# Patient Record
Sex: Female | Born: 1970 | Race: White | Hispanic: No | Smoking: Former smoker
Health system: Southern US, Community
[De-identification: ages and names within clinical notes are randomized; demographics above are authoritative.]

## PROBLEM LIST (undated history)

## (undated) DIAGNOSIS — G8929 Other chronic pain: Secondary | ICD-10-CM

## (undated) DIAGNOSIS — K589 Irritable bowel syndrome without diarrhea: Secondary | ICD-10-CM

## (undated) DIAGNOSIS — I341 Nonrheumatic mitral (valve) prolapse: Secondary | ICD-10-CM

## (undated) DIAGNOSIS — E785 Hyperlipidemia, unspecified: Secondary | ICD-10-CM

## (undated) DIAGNOSIS — F32A Depression, unspecified: Secondary | ICD-10-CM

## (undated) DIAGNOSIS — R51 Headache: Secondary | ICD-10-CM

## (undated) DIAGNOSIS — F329 Major depressive disorder, single episode, unspecified: Secondary | ICD-10-CM

## (undated) DIAGNOSIS — F419 Anxiety disorder, unspecified: Secondary | ICD-10-CM

## (undated) DIAGNOSIS — I471 Supraventricular tachycardia, unspecified: Secondary | ICD-10-CM

## (undated) DIAGNOSIS — D219 Benign neoplasm of connective and other soft tissue, unspecified: Secondary | ICD-10-CM

## (undated) DIAGNOSIS — E559 Vitamin D deficiency, unspecified: Secondary | ICD-10-CM

## (undated) DIAGNOSIS — R87629 Unspecified abnormal cytological findings in specimens from vagina: Secondary | ICD-10-CM

## (undated) DIAGNOSIS — N390 Urinary tract infection, site not specified: Secondary | ICD-10-CM

## (undated) DIAGNOSIS — K635 Polyp of colon: Secondary | ICD-10-CM

## (undated) DIAGNOSIS — R519 Headache, unspecified: Secondary | ICD-10-CM

## (undated) DIAGNOSIS — M797 Fibromyalgia: Secondary | ICD-10-CM

## (undated) HISTORY — PX: FOOT SURGERY: SHX648

## (undated) HISTORY — DX: Anxiety disorder, unspecified: F41.9

## (undated) HISTORY — PX: BREAST EXCISIONAL BIOPSY: SUR124

## (undated) HISTORY — DX: Major depressive disorder, single episode, unspecified: F32.9

## (undated) HISTORY — DX: Supraventricular tachycardia, unspecified: I47.10

## (undated) HISTORY — DX: Fibromyalgia: M79.7

## (undated) HISTORY — DX: Headache: R51

## (undated) HISTORY — DX: Headache, unspecified: R51.9

## (undated) HISTORY — DX: Supraventricular tachycardia: I47.1

## (undated) HISTORY — DX: Other chronic pain: G89.29

## (undated) HISTORY — PX: TUBAL LIGATION: SHX77

## (undated) HISTORY — DX: Nonrheumatic mitral (valve) prolapse: I34.1

## (undated) HISTORY — DX: Irritable bowel syndrome, unspecified: K58.9

## (undated) HISTORY — PX: CERVICAL BIOPSY  W/ LOOP ELECTRODE EXCISION: SUR135

## (undated) HISTORY — DX: Unspecified abnormal cytological findings in specimens from vagina: R87.629

## (undated) HISTORY — DX: Polyp of colon: K63.5

## (undated) HISTORY — DX: Benign neoplasm of connective and other soft tissue, unspecified: D21.9

## (undated) HISTORY — PX: WISDOM TOOTH EXTRACTION: SHX21

## (undated) HISTORY — DX: Hyperlipidemia, unspecified: E78.5

## (undated) HISTORY — PX: OTHER SURGICAL HISTORY: SHX169

## (undated) HISTORY — DX: Urinary tract infection, site not specified: N39.0

## (undated) HISTORY — DX: Vitamin D deficiency, unspecified: E55.9

## (undated) HISTORY — DX: Depression, unspecified: F32.A

## (undated) HISTORY — PX: BREAST BIOPSY: SHX20

---

## 2017-03-25 ENCOUNTER — Telehealth: Payer: Self-pay

## 2017-03-25 NOTE — Telephone Encounter (Signed)
SENT NOTES TO SCHEDULING 
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
## 2017-04-15 ENCOUNTER — Ambulatory Visit (INDEPENDENT_AMBULATORY_CARE_PROVIDER_SITE_OTHER): Payer: Medicaid Other | Admitting: Internal Medicine

## 2017-04-15 ENCOUNTER — Encounter: Payer: Self-pay | Admitting: Internal Medicine

## 2017-04-15 ENCOUNTER — Encounter (INDEPENDENT_AMBULATORY_CARE_PROVIDER_SITE_OTHER): Payer: Self-pay

## 2017-04-15 VITALS — BP 112/82 | HR 64 | Ht 67.0 in | Wt 218.4 lb

## 2017-04-15 DIAGNOSIS — I471 Supraventricular tachycardia: Secondary | ICD-10-CM | POA: Diagnosis not present

## 2017-04-15 DIAGNOSIS — Z7689 Persons encountering health services in other specified circumstances: Secondary | ICD-10-CM

## 2017-04-15 NOTE — Patient Instructions (Signed)
Your physician recommends that you continue on your current medications as directed. Please refer to the Current Medication list given to you today.  Your physician wants you to follow-up in: 1 year with Dr. Harrington Challenger.  You will receive a reminder letter in the mail two months in advance. If you don't receive a letter, please call our office to schedule the follow-up appointment.  We will request your recent lab work from Dr. Boston Service.  We will contact you after Dr. Harrington Challenger reviews if there are any new recommendations.

## 2017-04-15 NOTE — Progress Notes (Addendum)
Cardiology Office Note   Date:  04/15/2017   ID:  Chelsea Cherry, DOB 03-Nov-1970, MRN [id number]  PCP:  [facility]  Cardiologist:   Dorris Carnes, MD   Pt is self referred for continued cardiac care     History of Present Illness: Chelsea Cherry is a 47 y.o. female with a history of palpitations and reported  SVT     She recently moved to [facility] from [place]  Was followed there for years  Last cardiologist in [place].  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   Stress echo 09/2014  Normal   Hx PVCs,  Hx SVT    Current Meds  Medication Sig  . DULoxetine (CYMBALTA) 30 MG capsule Take 90 mg by mouth daily.  Marland Kitchen gabapentin (NEURONTIN) 300 MG capsule Take 1,500 mg by mouth as directed.  Marland Kitchen glucosamine-chondroitin 500-400 MG tablet Take 1 tablet by mouth 3 (three) times daily.  Marland Kitchen lamoTRIgine (LAMICTAL) 200 MG tablet Take 300 mg by mouth daily.  Marland Kitchen loratadine (CLARITIN) 10 MG tablet Take 10 mg by mouth daily.  . methocarbamol (ROBAXIN) 750 MG tablet Take 1,500 mg by mouth at bedtime.  . metoprolol succinate (TOPROL-XL) 50 MG 24 hr tablet Take 75 mg by mouth daily. Take with or immediately following a meal.  . Multiple Vitamin (MULTIVITAMIN) tablet Take 1 tablet by mouth daily.  . naproxen (NAPROSYN) 500 MG tablet Take 500 mg by mouth daily.  Marland Kitchen omeprazole (PRILOSEC) 20 MG capsule Take 20 mg by mouth daily.     Allergies:   Patient has no known allergies.   Past Medical History:  Diagnosis Date  . Fibromyalgia   . Mitral valve prolapse      Past Surgical History:  Procedure Laterality Date  . BREAST LUMPECTOMY    . TUBAL LIGATION       Social History:  The patient  reports that she has quit smoking. she has never used smokeless tobacco.   Family History:  The patient's family history includes Fibromyalgia in her mother.    ROS:  Please see the history of present illness. All other systems are reviewed and  Negative to the above problem except as noted.    PHYSICAL EXAM: VS:  BP 112/82   Pulse 64   Ht 5\' 7"  (1.702 m)   Wt 218 lb 6.4 oz (99.1 kg)   BMI 34.21 kg/m   GEN: Obese 47 yo  in no acute distress  HEENT: normal  Neck: no JVD, carotid bruits, or masses Cardiac: RRR; no murmurs, rubs, or gallops,no edema  Respiratory:  clear to auscultation bilaterally, normal work of breathing GI: soft, nontender, nondistended, + BS  No hepatomegaly  MS: no deformity Moving all extremities   Skin: warm and dry, no rash Neuro:  Strength and sensation are intact Psych: euthymic mood, full affect  EKG:  EKG is ordered today.SR  64 bpm     Lipid Panel No results found for: CHOL, TRIG, HDL, CHOLHDL, VLDL, LDLCALC, LDLDIRECT    Wt Readings from Last 3 Encounters:  04/15/17 218 lb 6.4 oz (99.1 kg)      ASSESSMENT AND PLAN:  1  Rhythm  Reported history of SVT  Will get records   Doing OK with no recent recurrence   Continue on metoprolol  2  Cardiac risk assessment.  Mother with CAD   Was a big smoker    Will f/u with lipids from Dr Bobby Rumpf' office.   Review records to determine if any action needed  Encouraged her to stay active   I do not think she is having any active symptoms     F/U in 1 year      Current medicines are reviewed at length with the patient today.  The patient does not have concerns regarding medicines.  Signed, Dorris Carnes, MD  04/15/2017 3:24 PM    [facility] [address], [place], [place]  [address] Phone: [phone]; Fax: [phone]

## 2017-04-17 ENCOUNTER — Ambulatory Visit (INDEPENDENT_AMBULATORY_CARE_PROVIDER_SITE_OTHER): Payer: Medicaid Other | Admitting: Psychiatry

## 2017-04-17 ENCOUNTER — Encounter (HOSPITAL_COMMUNITY): Payer: Self-pay | Admitting: Psychiatry

## 2017-04-17 VITALS — BP 141/83 | HR 95 | Ht 67.0 in | Wt 217.0 lb

## 2017-04-17 DIAGNOSIS — F419 Anxiety disorder, unspecified: Secondary | ICD-10-CM | POA: Diagnosis not present

## 2017-04-17 DIAGNOSIS — F331 Major depressive disorder, recurrent, moderate: Secondary | ICD-10-CM | POA: Diagnosis not present

## 2017-04-17 DIAGNOSIS — F4310 Post-traumatic stress disorder, unspecified: Secondary | ICD-10-CM

## 2017-04-17 DIAGNOSIS — Z87891 Personal history of nicotine dependence: Secondary | ICD-10-CM

## 2017-04-17 DIAGNOSIS — F39 Unspecified mood [affective] disorder: Secondary | ICD-10-CM | POA: Diagnosis not present

## 2017-04-17 DIAGNOSIS — Z818 Family history of other mental and behavioral disorders: Secondary | ICD-10-CM

## 2017-04-17 DIAGNOSIS — Z811 Family history of alcohol abuse and dependence: Secondary | ICD-10-CM

## 2017-04-17 DIAGNOSIS — I998 Other disorder of circulatory system: Secondary | ICD-10-CM | POA: Diagnosis not present

## 2017-04-17 DIAGNOSIS — Z62810 Personal history of physical and sexual abuse in childhood: Secondary | ICD-10-CM | POA: Diagnosis not present

## 2017-04-17 DIAGNOSIS — M797 Fibromyalgia: Secondary | ICD-10-CM

## 2017-04-17 MED ORDER — GABAPENTIN 300 MG PO CAPS: ORAL_CAPSULE | ORAL | 1 refills | Status: DC

## 2017-04-17 MED ORDER — LAMOTRIGINE 200 MG PO TABS: 300.0000 mg | ORAL_TABLET | Freq: Every day | ORAL | 1 refills | Status: DC

## 2017-04-17 NOTE — Patient Instructions (Signed)
1. Continue lamotrigine 300 mg daily  2. Continue gabapentin 300 mg in AM, 1200 mg qPM 2. (She is on duloxetine 90 mg daily) 3. Return to clinic in two months for 30 mins

## 2017-04-28 ENCOUNTER — Telehealth: Payer: Self-pay | Admitting: Internal Medicine

## 2017-04-28 NOTE — Telephone Encounter (Signed)
Records received from [facility] in [facility]

## 2017-05-07 ENCOUNTER — Other Ambulatory Visit: Payer: Self-pay | Admitting: *Deleted

## 2017-05-07 ENCOUNTER — Other Ambulatory Visit (HOSPITAL_COMMUNITY): Payer: Self-pay | Admitting: Psychiatry

## 2017-05-07 MED ORDER — METOPROLOL SUCCINATE ER 50 MG PO TB24: ORAL_TABLET | ORAL | 11 refills | Status: DC

## 2017-05-07 MED ORDER — DULOXETINE HCL 30 MG PO CPEP: 90.0000 mg | ORAL_CAPSULE | Freq: Every day | ORAL | 1 refills | Status: DC

## 2017-05-22 ENCOUNTER — Encounter: Payer: Self-pay | Admitting: Physician Assistant

## 2017-06-02 ENCOUNTER — Encounter (INDEPENDENT_AMBULATORY_CARE_PROVIDER_SITE_OTHER): Payer: Self-pay

## 2017-06-02 ENCOUNTER — Ambulatory Visit: Payer: Medicaid Other | Admitting: Physician Assistant

## 2017-06-02 ENCOUNTER — Encounter: Payer: Self-pay | Admitting: Physician Assistant

## 2017-06-02 VITALS — BP 100/62 | HR 85 | Ht 67.0 in | Wt 220.0 lb

## 2017-06-02 DIAGNOSIS — Z860100 Personal history of colon polyps, unspecified: Secondary | ICD-10-CM

## 2017-06-02 DIAGNOSIS — Z8719 Personal history of other diseases of the digestive system: Secondary | ICD-10-CM | POA: Diagnosis not present

## 2017-06-02 DIAGNOSIS — K625 Hemorrhage of anus and rectum: Secondary | ICD-10-CM

## 2017-06-02 DIAGNOSIS — K219 Gastro-esophageal reflux disease without esophagitis: Secondary | ICD-10-CM | POA: Diagnosis not present

## 2017-06-02 DIAGNOSIS — Z8711 Personal history of peptic ulcer disease: Secondary | ICD-10-CM

## 2017-06-02 MED ORDER — OMEPRAZOLE 20 MG PO CPDR: 20.0000 mg | DELAYED_RELEASE_CAPSULE | Freq: Every day | ORAL | 3 refills | Status: DC

## 2017-06-02 MED ORDER — NA SULFATE-K SULFATE-MG SULF 17.5-3.13-1.6 GM/177ML PO SOLN: 1.0000 | ORAL | 0 refills | Status: DC

## 2017-06-02 NOTE — Progress Notes (Signed)
Reviewed and agree with initial management plan.  Angelena Sand T. Omah Dewalt, MD FACG 

## 2017-06-02 NOTE — Patient Instructions (Addendum)
You have been scheduled for an endoscopy and colonoscopy. Please follow the written instructions given to you at your visit today. Please pick up your prep supplies at the pharmacy within the next 1-3 days. If you use inhalers (even only as needed), please bring them with you on the day of your procedure. Your physician has requested that you go to www.startemmi.com and enter the access code given to you at your visit today. This web site gives a general overview about your procedure. However, you should still follow specific instructions given to you by our office regarding your preparation for the procedure.  We have sent the following medications to your pharmacy for you to pick up at your convenience: Omeprazole 20 mg daily

## 2017-06-02 NOTE — Progress Notes (Signed)
Chief Complaint: "burning in my esophagus and blood in my stool"  HPI:    Chelsea Cherry is a 47 year old female with a past medical history of fibromyalgia and mitral valve prolapse who presents to clinic today as a referral from her primary care physician, Ricky Ala, NP,  for a complaint of "burning in my esophagus and blood in my stool".    Today, explains she moved from [place] to [place] in October.  Has a history of rectal bleeding in 2000 for which she had a colonoscopy and reports a finding of polyps and internal hemorrhoids, though she cannot remember where this was done and does not have her report.  Also reports history of a gastric ulcer in 2014 for which she was started on Omeprazole 20 mg daily.  She never had a repeat EGD.    Remains on Omeprazole 20 mg daily for reflux symptoms.  If she stops this medication for even more than 1 day she will have "burning in my esophagus".  On her Omeprazole she does well.    Also rectal bleeding which was intermittent over the past couple of years but became constant 2 months ago. Occurs with every bowel movement, sometimes mixed in with the stool as well on the toilet paper and in the toilet.  Denies straining but does have a harder stool a couple times a week depending on her diet.    Denies fever, chills, nausea, vomiting, dysphagia, abdominal or rectal pain, or symptoms that awaken her at night.   Past Medical History:  Diagnosis Date  . Fibromyalgia   . Mitral valve prolapse     Past Surgical History:  Procedure Laterality Date  . BREAST LUMPECTOMY    . TUBAL LIGATION      Current Outpatient Medications  Medication Sig Dispense Refill  . DULoxetine (CYMBALTA) 30 MG capsule Take 3 capsules (90 mg total) by mouth daily. (Patient taking differently: Take 90 mg by mouth daily at 2 PM. ) 90 capsule 1  . gabapentin (NEURONTIN) 300 MG capsule 300 mg (1 tab) in AM, 1200 mg (4 tab) in PM 150 capsule 1  . glucosamine-chondroitin  500-400 MG tablet Take 1 tablet by mouth 2 (two) times daily.     Marland Kitchen lamoTRIgine (LAMICTAL) 200 MG tablet Take 1.5 tablets (300 mg total) by mouth daily. 45 tablet 1  . loratadine (CLARITIN) 10 MG tablet Take 10 mg by mouth daily.    . methocarbamol (ROBAXIN) 750 MG tablet Take 1,500 mg by mouth at bedtime.    . metoprolol succinate (TOPROL-XL) 50 MG 24 hr tablet Take one and one-half tablet by mouth daily.Take with or immediately following a meal. 45 tablet 11  . Multiple Vitamin (MULTIVITAMIN) tablet Take 1 tablet by mouth daily.    . naproxen (NAPROSYN) 500 MG tablet Take 500 mg by mouth daily.    Marland Kitchen omeprazole (PRILOSEC) 20 MG capsule Take 20 mg by mouth daily.     No current facility-administered medications for this visit.     Allergies as of 06/02/2017  . (No Known Allergies)    Family History  Problem Relation Age of Onset  . Fibromyalgia Mother   . Depression Mother   . Anxiety disorder Mother   . Alcohol abuse Maternal Uncle   . Alcohol abuse Maternal Grandmother     Social History   Socioeconomic History  . Marital status: Unknown    Spouse name: Not on file  . Number of children: Not on file  .  Years of education: Not on file  . Highest education level: Not on file  Social Needs  . Financial resource strain: Not on file  . Food insecurity - worry: Not on file  . Food insecurity - inability: Not on file  . Transportation needs - medical: Not on file  . Transportation needs - non-medical: Not on file  Occupational History  . Not on file  Tobacco Use  . Smoking status: Former Research scientist (life sciences)  . Smokeless tobacco: Never Used  Substance and Sexual Activity  . Alcohol use: Not on file  . Drug use: Not on file  . Sexual activity: Not on file  Other Topics Concern  . Not on file  Social History Narrative  . Not on file    Review of Systems:    Constitutional: No weight loss, fever or chills Skin: No rash  Cardiovascular: No chest pain  Respiratory: No  SOB Gastrointestinal: See HPI and otherwise negative Genitourinary: No dysuria or change in urinary frequency Neurological: No headache, dizziness or syncope Musculoskeletal: No new muscle or joint pain Hematologic: No bruising Psychiatric: Positive for depression    Physical Exam:  Vital signs: BP 100/62   Pulse 85   Ht 5\' 7"  (1.702 m)   Wt 220 lb (99.8 kg)   BMI 34.46 kg/m    Constitutional:   Very Pleasant Overweight Caucasian female appears to be in NAD, Well developed, Well nourished, alert and cooperative Head:  Normocephalic and atraumatic. Eyes:   PEERL, EOMI. No icterus. Conjunctiva pink. Ears:  Normal auditory acuity. Neck:  Supple Throat: Oral cavity and pharynx without inflammation, swelling or lesion.  Respiratory: Respirations even and unlabored. Lungs clear to auscultation bilaterally.   No wheezes, crackles, or rhonchi.  Cardiovascular: Normal S1, S2. No MRG. Regular rate and rhythm. No peripheral edema, cyanosis or pallor.  Gastrointestinal:  Soft, nondistended, Mild epigastric ttp, No rebound or guarding. Normal bowel sounds. No appreciable masses or hepatomegaly. Rectal: External: Hemorrhoid tag; internal: No tenderness, no fissure Msk:  Symmetrical without gross deformities. Without edema, no deformity or joint abnormality.  Neurologic:  Alert and  oriented x4;  grossly normal neurologically.  Skin:   Dry and intact without significant lesions or rashes. Psychiatric: Demonstrates good judgement and reason without abnormal affect or behaviors.  Recent labs: 04/16/17-CMP and CBC normal.  Assessment: 1. GERD: For at least 5 years, controlled with Omeprazole 20 mg, vague history of bleeding gastric ulcer and hospitalization in 2014, we cannot find records 2. Rectal bleeding: Intermittent for a few years, constant over the past few months, only with a bowel movement, rectal exam unremarkable today, history of polyps per patient report; consider polyps versus  internal hemorrhoids 3. H/o gastric ulcer: In 2014 4. H/o colon polyps: In 2000  Plan: 1.  Scheduled patient for a diagnostic Colonoscopy and EGD in the [place] with Dr. Fuller Plan for patient's history of rectal bleeding as well as reflux.  Did discuss risk, benefits, limitations and alternatives and the patient agrees to proceed. 2.  Attempted to get patient's records from prior gastroenterologist but she is unaware of who did her colonoscopy or EGD and does not have records. 3.  Patient to continue Omeprazole 20 mg daily.  Refilled prescription for 90 days with 3 refills. 4.  Reviewed antireflux diet and lifestyle modifications. 5.  Patient to await further recommendations after time of procedures with Dr. Fuller Plan.  Ellouise Newer, PA-C [facility] 06/02/2017, 8:58 AM  CC: Ricky Ala, NP
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
## 2017-06-11 ENCOUNTER — Other Ambulatory Visit (HOSPITAL_COMMUNITY): Payer: Self-pay | Admitting: Family

## 2017-06-11 DIAGNOSIS — Z1231 Encounter for screening mammogram for malignant neoplasm of breast: Secondary | ICD-10-CM

## 2017-06-15 ENCOUNTER — Encounter (HOSPITAL_COMMUNITY): Payer: Self-pay | Admitting: Psychiatry

## 2017-06-15 ENCOUNTER — Ambulatory Visit (HOSPITAL_COMMUNITY): Payer: Medicaid Other | Admitting: Psychiatry

## 2017-06-15 VITALS — BP 108/74 | HR 65 | Ht 67.0 in

## 2017-06-15 DIAGNOSIS — Z811 Family history of alcohol abuse and dependence: Secondary | ICD-10-CM | POA: Diagnosis not present

## 2017-06-15 DIAGNOSIS — R450 Nervousness: Secondary | ICD-10-CM

## 2017-06-15 DIAGNOSIS — F419 Anxiety disorder, unspecified: Secondary | ICD-10-CM

## 2017-06-15 DIAGNOSIS — Z560 Unemployment, unspecified: Secondary | ICD-10-CM

## 2017-06-15 DIAGNOSIS — Z818 Family history of other mental and behavioral disorders: Secondary | ICD-10-CM | POA: Diagnosis not present

## 2017-06-15 DIAGNOSIS — F331 Major depressive disorder, recurrent, moderate: Secondary | ICD-10-CM | POA: Diagnosis not present

## 2017-06-15 DIAGNOSIS — G4700 Insomnia, unspecified: Secondary | ICD-10-CM

## 2017-06-15 DIAGNOSIS — Z87891 Personal history of nicotine dependence: Secondary | ICD-10-CM

## 2017-06-15 MED ORDER — DULOXETINE HCL 60 MG PO CPEP: 120.0000 mg | ORAL_CAPSULE | Freq: Every day | ORAL | 0 refills | Status: DC

## 2017-06-15 MED ORDER — LAMOTRIGINE 200 MG PO TABS: 200.0000 mg | ORAL_TABLET | Freq: Every day | ORAL | 0 refills | Status: DC

## 2017-06-15 MED ORDER — LAMOTRIGINE 100 MG PO TABS: ORAL_TABLET | ORAL | 0 refills | Status: DC

## 2017-06-15 NOTE — Patient Instructions (Signed)
1. Decrease lamotrigine 250 mg daily (200 mg + 50 mg) for one month, then 200 mg daily  2. Continue gabapentin 300 mg in AM, 1200 mg qPM 3. Increase duloxetine 120 mg daily  4. Return to clinic in three months for 15 mins

## 2017-06-22 ENCOUNTER — Other Ambulatory Visit (HOSPITAL_COMMUNITY): Payer: Self-pay | Admitting: Psychiatry

## 2017-06-22 MED ORDER — GABAPENTIN 300 MG PO CAPS: ORAL_CAPSULE | ORAL | 2 refills | Status: DC

## 2017-07-07 ENCOUNTER — Ambulatory Visit: Payer: Self-pay

## 2017-07-07 ENCOUNTER — Other Ambulatory Visit: Payer: Self-pay | Admitting: Family Medicine

## 2017-07-07 DIAGNOSIS — N643 Galactorrhea not associated with childbirth: Secondary | ICD-10-CM

## 2017-07-30 ENCOUNTER — Encounter: Payer: Self-pay | Admitting: Gastroenterology

## 2017-07-30 ENCOUNTER — Other Ambulatory Visit: Payer: Self-pay

## 2017-07-30 ENCOUNTER — Ambulatory Visit (AMBULATORY_SURGERY_CENTER): Payer: Medicaid Other | Admitting: Gastroenterology

## 2017-07-30 VITALS — BP 105/68 | HR 64 | Temp 97.8°F | Resp 16 | Ht 67.0 in | Wt 220.0 lb

## 2017-07-30 DIAGNOSIS — K625 Hemorrhage of anus and rectum: Secondary | ICD-10-CM | POA: Diagnosis not present

## 2017-07-30 DIAGNOSIS — K319 Disease of stomach and duodenum, unspecified: Secondary | ICD-10-CM | POA: Diagnosis not present

## 2017-07-30 DIAGNOSIS — K219 Gastro-esophageal reflux disease without esophagitis: Secondary | ICD-10-CM

## 2017-07-30 DIAGNOSIS — K3189 Other diseases of stomach and duodenum: Secondary | ICD-10-CM | POA: Diagnosis not present

## 2017-07-30 DIAGNOSIS — K640 First degree hemorrhoids: Secondary | ICD-10-CM | POA: Diagnosis not present

## 2017-07-30 DIAGNOSIS — K2950 Unspecified chronic gastritis without bleeding: Secondary | ICD-10-CM | POA: Diagnosis not present

## 2017-07-30 MED ORDER — SODIUM CHLORIDE 0.9 % IV SOLN: 500.0000 mL | Freq: Once | INTRAVENOUS | Status: DC

## 2017-07-30 NOTE — Patient Instructions (Signed)
Impression/Recommendations:  Hemorrhoid handout given to patient. GERD handout given to patient.  Repeat colonoscopy in 10 years for screening purposes.  Resume previous diet. Continue present medications.  Follow up with PCP.  YOU HAD AN ENDOSCOPIC PROCEDURE TODAY AT [facility]:   Refer to the procedure report that was given to you for any specific questions about what was found during the examination.  If the procedure report does not answer your questions, please call your gastroenterologist to clarify.  If you requested that your care partner not be given the details of your procedure findings, then the procedure report has been included in a sealed envelope for you to review at your convenience later.  YOU SHOULD EXPECT: Some feelings of bloating in the abdomen. Passage of more gas than usual.  Walking can help get rid of the air that was put into your GI tract during the procedure and reduce the bloating. If you had a lower endoscopy (such as a colonoscopy or flexible sigmoidoscopy) you may notice spotting of blood in your stool or on the toilet paper. If you underwent a bowel prep for your procedure, you may not have a normal bowel movement for a few days.  Please Note:  You might notice some irritation and congestion in your nose or some drainage.  This is from the oxygen used during your procedure.  There is no need for concern and it should clear up in a day or so.  SYMPTOMS TO REPORT IMMEDIATELY:   Following lower endoscopy (colonoscopy or flexible sigmoidoscopy):  Excessive amounts of blood in the stool  Significant tenderness or worsening of abdominal pains  Swelling of the abdomen that is new, acute  Fever of 100F or higher   Following upper endoscopy (EGD)  Vomiting of blood or coffee ground material  New chest pain or pain under the shoulder blades  Painful or persistently difficult swallowing  New shortness of breath  Fever of 100F or  higher  Black, tarry-looking stools  For urgent or emergent issues, a gastroenterologist can be reached at any hour by calling [phone].   DIET:  We do recommend a small meal at first, but then you may proceed to your regular diet.  Drink plenty of fluids but you should avoid alcoholic beverages for 24 hours.  ACTIVITY:  You should plan to take it easy for the rest of today and you should NOT DRIVE or use heavy machinery until tomorrow (because of the sedation medicines used during the test).    FOLLOW UP: Our staff will call the number listed on your records the next business day following your procedure to check on you and address any questions or concerns that you may have regarding the information given to you following your procedure. If we do not reach you, we will leave a message.  However, if you are feeling well and you are not experiencing any problems, there is no need to return our call.  We will assume that you have returned to your regular daily activities without incident.  If any biopsies were taken you will be contacted by phone or by letter within the next 1-3 weeks.  Please call us at [phone] if you have not heard about the biopsies in 3 weeks.    SIGNATURES/CONFIDENTIALITY: You and/or your care partner have signed paperwork which will be entered into your electronic medical record.  These signatures attest to the fact that that the information above on your After Visit Summary  has been reviewed and is understood.  Full responsibility of the confidentiality of this discharge information lies with you and/or your care-partner.

## 2017-07-30 NOTE — Progress Notes (Signed)
Called to room to assist during endoscopic procedure.  Patient ID and intended procedure confirmed with present staff. Received instructions for my participation in the procedure from the performing physician.  

## 2017-07-30 NOTE — Op Note (Signed)
[facility] Patient Name: Chelsea Cherry Procedure Date: 07/30/2017 10:35 AM MRN: [id number] Endoscopist: Ladene Artist , MD Age: 47 Referring MD:  Date of Birth: [birth year] Gender: Female Account #: [id number] Procedure:                Upper GI endoscopy Indications:              Gastroesophageal reflux disease Medicines:                Monitored Anesthesia Care Procedure:                Pre-Anesthesia Assessment:                           - Prior to the procedure, a History and Physical                            was performed, and patient medications and                            allergies were reviewed. The patient's tolerance of                            previous anesthesia was also reviewed. The risks                            and benefits of the procedure and the sedation                            options and risks were discussed with the patient.                            All questions were answered, and informed consent                            was obtained. Prior Anticoagulants: The patient has                            taken no previous anticoagulant or antiplatelet                            agents. ASA Grade Assessment: II - A patient with                            mild systemic disease. After reviewing the risks                            and benefits, the patient was deemed in                            satisfactory condition to undergo the procedure.                           After obtaining informed consent, the endoscope was  passed under direct vision. Throughout the                            procedure, the patient's blood pressure, pulse, and                            oxygen saturations were monitored continuously. The                            Model GIF-[id number]) scope was introduced                            through the mouth, and advanced to the second part                            of duodenum.  The upper GI endoscopy was                            accomplished without difficulty. The patient                            tolerated the procedure well. Scope In: Scope Out: Findings:                 The examined esophagus was normal.                           Patchy moderately erythematous mucosa without                            bleeding was found in the gastric body and in the                            gastric antrum. Biopsies were taken with a cold                            forceps for histology. Verification of patient                            identification for the specimen was done by the                            nurse. Estimated blood loss was minimal.                           The exam of the stomach was otherwise normal.                           The duodenal bulb and second portion of the                            duodenum were normal. Complications:            No immediate complications. Estimated Blood Loss:     Estimated blood loss was minimal. Impression:               -  Normal esophagus.                           - Erythematous mucosa in the gastric body and                            antrum. Biopsied.                           - Normal duodenal bulb and second portion of the                            duodenum. Recommendation:           - Patient has a contact number available for                            emergencies. The signs and symptoms of potential                            delayed complications were discussed with the                            patient. Return to normal activities tomorrow.                            Written discharge instructions were provided to the                            patient.                           - Resume previous diet.                           - Continue present medications.                           - Await pathology results.                           - Antireflux measures.                           - Follow up  with PCP. Ladene Artist, MD 07/30/2017 11:23:01 AM This report has been signed electronically.

## 2017-07-30 NOTE — Progress Notes (Signed)
A/ox3 pleased with MAC, report to [employer]

## 2017-07-30 NOTE — Op Note (Signed)
[facility] Patient Name: Chelsea Cherry Procedure Date: 07/30/2017 10:38 AM MRN: [id number] Endoscopist: Ladene Artist , MD Age: 47 Referring MD:  Date of Birth: [birth year] Gender: Female Account #: [id number] Procedure:                Colonoscopy Indications:              Hematochezia Medicines:                Monitored Anesthesia Care Procedure:                Pre-Anesthesia Assessment:                           - Prior to the procedure, a History and Physical                            was performed, and patient medications and                            allergies were reviewed. The patient's tolerance of                            previous anesthesia was also reviewed. The risks                            and benefits of the procedure and the sedation                            options and risks were discussed with the patient.                            All questions were answered, and informed consent                            was obtained. Prior Anticoagulants: The patient has                            taken no previous anticoagulant or antiplatelet                            agents. ASA Grade Assessment: II - A patient with                            mild systemic disease. After reviewing the risks                            and benefits, the patient was deemed in                            satisfactory condition to undergo the procedure.                           After obtaining informed consent, the colonoscope  was passed under direct vision. Throughout the                            procedure, the patient's blood pressure, pulse, and                            oxygen saturations were monitored continuously. The                            Model [id number]-[id number]) scope was introduced                            through the anus and advanced to the the cecum,                            identified by appendiceal orifice and  ileocecal                            valve. The ileocecal valve, appendiceal orifice,                            and rectum were photographed. The quality of the                            bowel preparation was excellent. The colonoscopy                            was performed without difficulty. The patient                            tolerated the procedure well. Scope In: 10:43:10 AM Scope Out: 10:59:11 AM Scope Withdrawal Time: 0 hours 12 minutes 4 seconds  Total Procedure Duration: 0 hours 16 minutes 1 second  Findings:                 Skin tags were found on perianal exam.                           Internal hemorrhoids were found during                            retroflexion. The hemorrhoids were small and Grade                            I (internal hemorrhoids that do not prolapse).                           The exam was otherwise without abnormality on                            direct and retroflexion views. Complications:            No immediate complications. Estimated blood loss:  None. Estimated Blood Loss:     Estimated blood loss: none. Impression:               - Perianal skin tags found on perianal exam.                           - Internal hemorrhoids.                           - The examination was otherwise normal on direct                            and retroflexion views.                           - No specimens collected. Recommendation:           - Repeat colonoscopy in 10 years for screening                            purposes.                           - Patient has a contact number available for                            emergencies. The signs and symptoms of potential                            delayed complications were discussed with the                            patient. Return to normal activities tomorrow.                            Written discharge instructions were provided to the                            patient.                            - Resume previous diet.                           - Continue present medications. Ladene Artist, MD 07/30/2017 11:02:16 AM This report has been signed electronically.

## 2017-07-31 ENCOUNTER — Telehealth: Payer: Self-pay | Admitting: *Deleted

## 2017-07-31 NOTE — Telephone Encounter (Signed)
  Follow up Call-  [phone] [facility] to leave message  Patient questions:  Do you have a fever, pain , or abdominal swelling? No. Pain Score  0 *  Have you tolerated food without any problems? Yes.    Have you been able to return to your normal activities? Yes.    Do you have any questions about your discharge instructions: Diet   No. Medications  No. Follow up visit  No.  Do you have questions or concerns about your Care? No.  Actions: * If pain score is 4 or above: No action needed, pain <4.

## 2017-08-09 ENCOUNTER — Encounter: Payer: Self-pay | Admitting: Gastroenterology

## 2017-08-17 ENCOUNTER — Ambulatory Visit: Payer: Medicaid Other | Admitting: Physical Medicine & Rehabilitation

## 2017-08-17 ENCOUNTER — Encounter: Payer: Medicaid Other | Attending: Physical Medicine & Rehabilitation

## 2017-08-17 ENCOUNTER — Encounter: Payer: Self-pay | Admitting: Physical Medicine & Rehabilitation

## 2017-08-17 VITALS — BP 113/77 | HR 66 | Ht 67.0 in | Wt 229.0 lb

## 2017-08-17 DIAGNOSIS — M797 Fibromyalgia: Secondary | ICD-10-CM | POA: Insufficient documentation

## 2017-08-17 DIAGNOSIS — K589 Irritable bowel syndrome without diarrhea: Secondary | ICD-10-CM | POA: Insufficient documentation

## 2017-08-17 DIAGNOSIS — Z87891 Personal history of nicotine dependence: Secondary | ICD-10-CM | POA: Insufficient documentation

## 2017-08-17 DIAGNOSIS — F329 Major depressive disorder, single episode, unspecified: Secondary | ICD-10-CM | POA: Insufficient documentation

## 2017-08-17 DIAGNOSIS — F419 Anxiety disorder, unspecified: Secondary | ICD-10-CM | POA: Diagnosis not present

## 2017-08-17 DIAGNOSIS — I341 Nonrheumatic mitral (valve) prolapse: Secondary | ICD-10-CM | POA: Diagnosis not present

## 2017-08-17 DIAGNOSIS — E785 Hyperlipidemia, unspecified: Secondary | ICD-10-CM | POA: Insufficient documentation

## 2017-08-17 MED ORDER — GABAPENTIN 100 MG PO CAPS: 100.0000 mg | ORAL_CAPSULE | Freq: Three times a day (TID) | ORAL | 1 refills | Status: DC

## 2017-08-17 MED ORDER — GABAPENTIN 600 MG PO TABS: 1200.0000 mg | ORAL_TABLET | Freq: Every day | ORAL | 1 refills | Status: DC

## 2017-08-17 NOTE — Patient Instructions (Signed)
Will change gabapentin to 100mg  three times a day and 1200MG  at night

## 2017-08-17 NOTE — Progress Notes (Signed)
Subjective:    Patient ID: Chelsea Cherry, female    DOB: 1970-08-21, 47 y.o.   MRN: [id number]  HPI CC:  Fibromyalgia  Diagnosed in 2009 by Dr Oletta Cohn, rheumatology in [facility].  Fell and cracked tailbone in 2009.  All over body pain with flare ups of different body parts time to time.  Low back pain even with loading dishwasher accompanied with funny bone type pain since Dec of 2018.  This has been quite intermittent.  She had one episode where she felt 1 of her knees might buckle.  No numbness or tingling in the lower extremities.  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  She is pursuing disability and has an attorney that is representing her.  Has been on Gabapentin 300mg  qam and 1200mg  at night  On Duloxetine 120mg  per day, tried to go down to 90mg   But had increased pain.  Was on methocarbamol in past but cannot tell the difference  Naprosyn not helpful  Has bladder urgency but no recent UTI, has freq UTIs in past  Starting to walk 20-55min ~4 days per week  Signed up for [employer], tried first class  Pain Inventory Average Pain 7 Pain Right Now 6 My pain is sharp, stabbing, tingling and aching  In the last 24 hours, has pain interfered with the following? General activity 5 Relation with others 8 Enjoyment of life 8 What TIME of day is your pain at its worst? all Sleep (in general) Poor  Pain is worse with: sitting and standing Pain improves with: . Relief from Meds: 4  Mobility walk without assistance ability to climb steps?  yes do you drive?  yes  Function Do you have any goals in this area?  yes  Neuro/Psych bladder control  problems weakness numbness tingling depression anxiety  Prior Studies Any changes since last visit?  no  Physicians involved in your care Any changes since last visit?  no   Family History  Problem Relation Age of Onset  . Fibromyalgia Mother   . Depression Mother   . Anxiety disorder Mother   . Alcohol abuse Maternal Uncle   . Alcohol abuse Maternal Grandmother   . Ovarian cancer Maternal Grandmother   . Bladder Cancer Maternal Grandmother    Social History   Socioeconomic History  . Marital status: Unknown    Spouse name: Not on file  . Number of children: Not on file  . Years of education: Not on file  . Highest education level: Not on file  Occupational History  . Not on file  Social Needs  . Financial resource strain: Not on file  . Food insecurity:    Worry: Not on file    Inability: Not on file  . Transportation needs:    Medical: Not on file    Non-medical: Not on file  Tobacco Use  . Smoking status: Former Research scientist (life sciences)  . Smokeless tobacco: Never Used  Substance and Sexual Activity  . Alcohol use: Yes    Alcohol/week: 0.6 oz    Types: 1 Standard drinks or equivalent per week    Comment: 3-4 week  . Drug use: Never  . Sexual activity: Not on  file  Lifestyle  . Physical activity:    Days per week: Not on file    Minutes per session: Not on file  . Stress: Not on file  Relationships  . Social connections:    Talks on phone: Not on file    Gets together: Not on file    Attends religious service: Not on file    Active member of club or organization: Not on file    Attends meetings of clubs or organizations: Not on file    Relationship status: Not on file  Other Topics Concern  . Not on file  Social History Narrative  . Not on file   Past Surgical History:  Procedure Laterality Date  . abdominal laproscopic surgery    . BREAST LUMPECTOMY    . FOOT SURGERY    . TUBAL LIGATION    . WISDOM TOOTH EXTRACTION     Past Medical History:  Diagnosis  Date  . Anxiety   . Chronic headaches   . Colon polyps   . Depression   . Fibromyalgia   . Hyperlipidemia   . IBS (irritable bowel syndrome)   . Mitral valve prolapse   . SVT (supraventricular tachycardia) ([place])   . UTI (urinary tract infection)    BP 113/77   Pulse 66   Ht 5\' 7"  (1.702 m)   Wt 229 lb (103.9 kg)   SpO2 94%   BMI 35.87 kg/m   Opioid Risk Score:   Fall Risk Score:  `1  Depression screen PHQ 2/9  No flowsheet data found.   Review of Systems  Constitutional: Positive for diaphoresis and unexpected weight change.  HENT: Negative.   Eyes: Negative.   Respiratory: Negative.   Cardiovascular: Negative.   Gastrointestinal: Negative.   Endocrine: Negative.   Genitourinary: Negative.   Musculoskeletal: Positive for arthralgias and myalgias.  Skin: Negative.   Allergic/Immunologic: Negative.   Neurological: Positive for numbness.  Hematological: Negative.   Psychiatric/Behavioral: Positive for dysphoric mood. The patient is nervous/anxious.   All other systems reviewed and are negative.      Objective:   Physical Exam  Constitutional: She is oriented to person, place, and time. She appears well-developed and well-nourished.  HENT:  Head: Normocephalic and atraumatic.  Eyes: Pupils are equal, round, and reactive to light. EOM are normal.  Neck: Normal range of motion. Neck supple.  Cardiovascular: Normal rate, regular rhythm, normal heart sounds and intact distal pulses.  Pulmonary/Chest: Effort normal and breath sounds normal. No stridor. No respiratory distress. She has no wheezes.  Abdominal: Soft. Bowel sounds are normal. She exhibits no distension. There is no tenderness.  Musculoskeletal:  75% range of motion in all planes in low back  Normal range of motion in the shoulders elbows wrists fingers knees ankles. Patient has some pain with endrange shoulder internal and external rotation.  Patient has tenderness bilateral upper trapezius bilateral  parascapular bilateral elbow bilateral low back and bilateral gluteal tender points.  No evidence of joint swelling in the fingers or wrists no joint swelling in the knees no erythema in the upper or lower limbs.    Neurological: She is alert and oriented to person, place, and time. Coordination and gait normal.  Motor strength is 5/5 bilateral deltoid, bicep, tricep, grip, hip flexor, knee extensor, ankle dorsiflexor and plantar flexor Sensation intact to pinprick bilateral C5 C6-C7-C8 L2-L3-L4 L5-S1 dermatome distribution  Psychiatric: She has a normal mood and affect. Her behavior is normal. Judgment and thought content normal.  Nursing note and vitals reviewed.         Assessment & Plan:  1.  Fibromyalgia syndrome she has widespread body pain that is migratory.  More recently she has had more symptoms in the low back area. We discussed her medication management and currently is taking gabapentin 300 mg in the morning and then takes 1200 mg nightly.  She is concerned about oversedation while driving during the day.  We discussed the usual duration of action for gabapentin.  Will trial gabapentin 100 mg 3 times daily and continue 1200 nightly We discussed other medications such as Lyrica as another potential treatment option. She will continue her duloxetine which she is taking at maximum doses for both anxiety depression as well as her fibromyalgia.  We discussed her exercise program have encouraged her to start aquatic exercise in addition to the taekwondo.  I will see her back in 8 weeks

## 2017-09-02 ENCOUNTER — Ambulatory Visit: Payer: Self-pay

## 2017-09-02 ENCOUNTER — Ambulatory Visit
Admission: RE | Admit: 2017-09-02 | Discharge: 2017-09-02 | Disposition: A | Discharge status: Home / Self Care | Payer: Medicaid Other | Source: Ambulatory Visit | Attending: Family Medicine | Admitting: Family Medicine

## 2017-09-02 DIAGNOSIS — N643 Galactorrhea not associated with childbirth: Secondary | ICD-10-CM

## 2017-09-07 ENCOUNTER — Other Ambulatory Visit: Payer: Self-pay

## 2017-09-09 ENCOUNTER — Ambulatory Visit
Admission: RE | Admit: 2017-09-09 | Discharge: 2017-09-09 | Disposition: A | Discharge status: Home / Self Care | Payer: Medicaid Other | Source: Ambulatory Visit | Attending: Family Medicine | Admitting: Family Medicine

## 2017-09-09 ENCOUNTER — Ambulatory Visit: Payer: Self-pay

## 2017-09-09 DIAGNOSIS — N643 Galactorrhea not associated with childbirth: Secondary | ICD-10-CM
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
## 2017-09-14 ENCOUNTER — Other Ambulatory Visit (HOSPITAL_COMMUNITY): Payer: Self-pay | Admitting: Psychiatry

## 2017-09-14 MED ORDER — DULOXETINE HCL 60 MG PO CPEP: 120.0000 mg | ORAL_CAPSULE | Freq: Every day | ORAL | 0 refills | Status: AC

## 2017-09-14 MED ORDER — LAMOTRIGINE 200 MG PO TABS: 200.0000 mg | ORAL_TABLET | Freq: Every day | ORAL | 0 refills | Status: DC

## 2017-09-15 ENCOUNTER — Ambulatory Visit (HOSPITAL_COMMUNITY): Payer: Medicaid Other | Admitting: Psychiatry

## 2017-09-15 ENCOUNTER — Encounter (HOSPITAL_COMMUNITY): Payer: Self-pay | Admitting: Psychiatry

## 2017-09-15 VITALS — BP 128/80 | HR 67 | Ht 67.0 in | Wt 224.0 lb

## 2017-09-15 DIAGNOSIS — Z818 Family history of other mental and behavioral disorders: Secondary | ICD-10-CM

## 2017-09-15 DIAGNOSIS — Z87891 Personal history of nicotine dependence: Secondary | ICD-10-CM

## 2017-09-15 DIAGNOSIS — F331 Major depressive disorder, recurrent, moderate: Secondary | ICD-10-CM | POA: Diagnosis not present

## 2017-09-15 DIAGNOSIS — Z811 Family history of alcohol abuse and dependence: Secondary | ICD-10-CM | POA: Diagnosis not present

## 2017-09-15 MED ORDER — LAMOTRIGINE 150 MG PO TABS: 150.0000 mg | ORAL_TABLET | Freq: Every day | ORAL | 0 refills | Status: DC

## 2017-09-15 NOTE — Patient Instructions (Signed)
1. Decrease lamotrigine 150 mg daily  2. Continue duloxetine 120 mg daily  3. Return to clinic in three months for 15 mins

## 2017-10-12 ENCOUNTER — Other Ambulatory Visit: Payer: Self-pay

## 2017-10-12 ENCOUNTER — Ambulatory Visit: Payer: Self-pay | Admitting: Physical Medicine & Rehabilitation

## 2017-10-12 ENCOUNTER — Emergency Department (HOSPITAL_COMMUNITY)
Admission: EM | Admit: 2017-10-12 | Discharge: 2017-10-12 | Disposition: A | Discharge status: Home / Self Care | Payer: Medicaid Other | Attending: Emergency Medicine | Admitting: Emergency Medicine

## 2017-10-12 ENCOUNTER — Encounter (HOSPITAL_COMMUNITY): Payer: Self-pay | Admitting: Emergency Medicine

## 2017-10-12 DIAGNOSIS — Z79899 Other long term (current) drug therapy: Secondary | ICD-10-CM | POA: Insufficient documentation

## 2017-10-12 DIAGNOSIS — R400 Somnolence: Secondary | ICD-10-CM | POA: Insufficient documentation

## 2017-10-12 DIAGNOSIS — Y658 Other specified misadventures during surgical and medical care: Secondary | ICD-10-CM | POA: Diagnosis not present

## 2017-10-12 DIAGNOSIS — T887XXA Unspecified adverse effect of drug or medicament, initial encounter: Secondary | ICD-10-CM | POA: Diagnosis not present

## 2017-10-12 DIAGNOSIS — R404 Transient alteration of awareness: Secondary | ICD-10-CM

## 2017-10-12 DIAGNOSIS — Z87891 Personal history of nicotine dependence: Secondary | ICD-10-CM | POA: Diagnosis not present

## 2017-10-12 DIAGNOSIS — T50905A Adverse effect of unspecified drugs, medicaments and biological substances, initial encounter: Secondary | ICD-10-CM | POA: Insufficient documentation

## 2017-10-12 LAB — CBC WITH DIFFERENTIAL/PLATELET
Basophils Absolute: 0 10*3/uL (ref 0.0–0.1)
Basophils Relative: 0 %
EOS ABS: 0 10*3/uL (ref 0.0–0.7)
EOS PCT: 0 %
HCT: 41.3 % (ref 36.0–46.0)
HEMOGLOBIN: 13.9 g/dL (ref 12.0–15.0)
Lymphocytes Relative: 37 %
Lymphs Abs: 2.1 10*3/uL (ref 0.7–4.0)
MCH: 30 pg (ref 26.0–34.0)
MCHC: 33.7 g/dL (ref 30.0–36.0)
MCV: 89.2 fL (ref 78.0–100.0)
Monocytes Absolute: 0.6 10*3/uL (ref 0.1–1.0)
Monocytes Relative: 11 %
Neutro Abs: 2.9 10*3/uL (ref 1.7–7.7)
Neutrophils Relative %: 52 %
PLATELETS: 239 10*3/uL (ref 150–400)
RBC: 4.63 MIL/uL (ref 3.87–5.11)
RDW: 13.5 % (ref 11.5–15.5)
WBC: 5.6 10*3/uL (ref 4.0–10.5)

## 2017-10-12 LAB — BASIC METABOLIC PANEL
Anion gap: 8 (ref 5–15)
BUN: 16 mg/dL (ref 6–20)
CHLORIDE: 105 mmol/L (ref 98–111)
CO2: 27 mmol/L (ref 22–32)
CREATININE: 0.94 mg/dL (ref 0.44–1.00)
Calcium: 9.2 mg/dL (ref 8.9–10.3)
GFR calc Af Amer: >60 mL/min (ref >60)
Glucose, Bld: 108 mg/dL — ABNORMAL HIGH (ref 70–99)
Potassium: 3.7 mmol/L (ref 3.5–5.1)
SODIUM: 140 mmol/L (ref 135–145)

## 2017-10-12 LAB — I-STAT BETA HCG BLOOD, ED (MC, WL, AP ONLY)

## 2017-10-12 NOTE — ED Notes (Signed)
Bed: AE49 Expected date:  Expected time:  Means of arrival:  Comments: EMS 51 f tiredness

## 2017-10-12 NOTE — ED Provider Notes (Signed)
[facility] DEPT Provider Note   CSN: [id number] Arrival date & time: 10/12/17  6415     History   Chief Complaint Chief Complaint  Patient presents with  . Drug Overdose    HPI Chelsea Cherry is a 47 y.o. female.  HPI Chelsea Cherry is a 47 y.o. female with hx of fibromyalgia, IBS, anxiety, depression, presents to emergency department with complaint of possible overdose.  Patient states that she has recently got back from vacation yesterday morning, states went to [place], states she was so tired when she got back that she slept all day.  She states she did not eat or drink anything all day.  She states that evening time she took 2 mg of her Xanax to help her go back to sleep for overnight and believes that she took both morning and evening medications by accident.  She states that she was woken up by her husband this morning because he thought that she was choking and she urinated on herself.  She states that the unusual for her.  She currently feels tired and sleepy, reports that her throat feels dry and she feels like her uvula is choking her.  Denies HI or SI.    Past Medical History:  Diagnosis Date  . Anxiety   . Chronic headaches   . Colon polyps   . Depression   . Fibromyalgia   . Hyperlipidemia   . IBS (irritable bowel syndrome)   . Mitral valve prolapse   . SVT (supraventricular tachycardia) ([place])   . UTI (urinary tract infection)     Patient Active Problem List   Diagnosis Date Noted  . MDD (major depressive disorder), recurrent episode, moderate ([place]) 04/17/2017    Past Surgical History:  Procedure Laterality Date  . abdominal laproscopic surgery    . BREAST BIOPSY Right   . BREAST EXCISIONAL BIOPSY Right    x3  . BREAST EXCISIONAL BIOPSY Left   . FOOT SURGERY    . TUBAL LIGATION    . WISDOM TOOTH EXTRACTION       OB History   None      Home Medications    Prior to Admission medications   Medication Sig  Start Date End Date Taking? Authorizing Provider  DULoxetine (CYMBALTA) 60 MG capsule Take 2 capsules (120 mg total) by mouth daily. 09/14/17   Norman Clay, MD  gabapentin (NEURONTIN) 100 MG capsule Take 1 capsule (100 mg total) by mouth 3 (three) times daily. 08/17/17   Kirsteins, Luanna Salk, MD  gabapentin (NEURONTIN) 600 MG tablet Take 2 tablets (1,200 mg total) by mouth at bedtime. 08/17/17   Kirsteins, Luanna Salk, MD  glucosamine-chondroitin 500-400 MG tablet Take 1 tablet by mouth 2 (two) times daily.     [provider]  lamoTRIgine (LAMICTAL) 150 MG tablet Take 1 tablet (150 mg total) by mouth daily. 09/15/17   Norman Clay, MD  loratadine (CLARITIN) 10 MG tablet Take 10 mg by mouth daily.    [provider]  metoprolol succinate (TOPROL-XL) 50 MG 24 hr tablet Take one and one-half tablet by mouth daily.Take with or immediately following a meal. 05/07/17   Fay Records, MD  Multiple Vitamin (MULTIVITAMIN) tablet Take 1 tablet by mouth daily.    [provider]  naproxen (NAPROSYN) 500 MG tablet Take 500 mg by mouth daily.    [provider]  omeprazole (PRILOSEC) 20 MG capsule Take 1 capsule (20 mg total) by mouth daily. 06/02/17  Levin Erp, [place]    Family History Family History  Problem Relation Age of Onset  . Fibromyalgia Mother   . Depression Mother   . Anxiety disorder Mother   . Alcohol abuse Maternal Uncle   . Alcohol abuse Maternal Grandmother   . Ovarian cancer Maternal Grandmother   . Bladder Cancer Maternal Grandmother     Social History Social History   Tobacco Use  . Smoking status: Former Research scientist (life sciences)  . Smokeless tobacco: Never Used  Substance Use Topics  . Alcohol use: Yes    Alcohol/week: 0.6 oz    Types: 1 Standard drinks or equivalent per week    Comment: 3-4 week  . Drug use: Never     Allergies   Patient has no known allergies.   Review of Systems Review of Systems  Constitutional: Positive for fatigue.  Negative for chills and fever.  HENT: Positive for sore throat.   Respiratory: Negative for cough, chest tightness and shortness of breath.   Cardiovascular: Negative for chest pain, palpitations and leg swelling.  Gastrointestinal: Negative for abdominal pain, diarrhea, nausea and vomiting.  Genitourinary: Negative for dysuria, flank pain, pelvic pain, vaginal bleeding, vaginal discharge and vaginal pain.  Musculoskeletal: Negative for arthralgias, myalgias, neck pain and neck stiffness.  Skin: Negative for rash.  Neurological: Positive for weakness. Negative for dizziness and headaches.  All other systems reviewed and are negative.    Physical Exam Updated Vital Signs BP 121/89 (BP Location: Left Arm)   Pulse 62   Temp 97.6 F (36.4 C) (Oral)   Resp 12   Ht 5\' 7"  (1.702 m)   Wt 104.3 kg (230 lb)   SpO2 97%   BMI 36.02 kg/m   Physical Exam  Constitutional: She appears well-developed and well-nourished.  appears somnolent but able to wake up and carry a normal conversation.   HENT:  Head: Normocephalic.  Normal oropharynx. Normal uvula  Eyes: Conjunctivae are normal.  Neck: Neck supple.  Cardiovascular: Normal rate, regular rhythm and normal heart sounds.  Pulmonary/Chest: Effort normal and breath sounds normal. No respiratory distress. She has no wheezes. She has no rales.  Abdominal: Soft. Bowel sounds are normal. She exhibits no distension. There is no tenderness. There is no rebound.  Musculoskeletal: She exhibits no edema.  Neurological: She is alert.  Skin: Skin is warm and dry.  Psychiatric: She has a normal mood and affect. Her behavior is normal.  Nursing note and vitals reviewed.    ED Treatments / Results  Labs (all labs ordered are listed, but only abnormal results are displayed) Labs Reviewed - No data to display  EKG None  Radiology No results found.  Procedures Procedures (including critical care time)  Medications Ordered in ED Medications -  No data to display   Initial Impression / Assessment and Plan / ED Course  I have reviewed the triage vital signs and the nursing notes.  Pertinent labs & imaging results that were available during my care of the patient were reviewed by me and considered in my medical decision making (see chart for details).     Pt in ED for accidental overdose? Took both morning and evening medications at 11pm. The only medication that she doubled on is gabapentin, took total 1500mg . Pt is somnolent but alert and oriented and able to carry on a normal conversation. Exam unremarkable. Will get basic labs, preg test, ecg  7:48 AM Labs unremarkable.  Vital signs continued to be normal.  Patient is very  sleepy but does wake up when you just speak to her and able to carry a normal conversation.  Family is at bedside.  Patient drinking ginger ale, tolerating it well.  She has no complaints other than chronic back pain and feeling sleepy.  I think patient is okay to be discharged home with her husband, return precautions discussed.  Husband agrees to take her home and will keep an eye on her.   Vitals:   10/12/17 0625 10/12/17 0626 10/12/17 0700 10/12/17 0730  BP: 121/89  105/64 108/66  Pulse: 62   64  Resp: 12  14 10   Temp: 97.6 F (36.4 C)     TempSrc: Oral     SpO2: 97%   97%  Weight:  104.3 kg (230 lb)    Height:  5\' 7"  (1.702 m)       Final Clinical Impressions(s) / ED Diagnoses   Final diagnoses:  Sedated due to medication    ED Discharge Orders    None       Jeannett Senior, PA-C 10/12/17 Carey, [place], DO 10/14/17 1203

## 2017-10-12 NOTE — Discharge Instructions (Addendum)
Please be careful when taking her medications.  Follow-up as needed.  Return if worsening symptoms.

## 2017-10-12 NOTE — ED Triage Notes (Signed)
Pt presents by EMS after accicently taking morning and evening dose of prescribed medications around 2300 on 10/11/17. Pt denies any SI/HI. Medications included Xanax, Gabapentin, Duloxetine, Lasix, Lamotrigine, Metoprolol, Potassium, Omeprazole. Pt reports feeling drowsy at this time.

## 2017-10-12 NOTE — ED Notes (Signed)
ED Provider at bedside. 
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
## 2017-12-16 ENCOUNTER — Ambulatory Visit (HOSPITAL_COMMUNITY): Payer: Self-pay | Admitting: Psychiatry

## 2018-05-14 ENCOUNTER — Other Ambulatory Visit: Payer: Self-pay | Admitting: Nurse Practitioner

## 2018-05-14 DIAGNOSIS — M545 Low back pain, unspecified: Secondary | ICD-10-CM

## 2018-05-18 ENCOUNTER — Other Ambulatory Visit: Payer: Self-pay

## 2018-05-18 ENCOUNTER — Emergency Department (HOSPITAL_COMMUNITY): Payer: Medicaid Other

## 2018-05-18 ENCOUNTER — Emergency Department (HOSPITAL_BASED_OUTPATIENT_CLINIC_OR_DEPARTMENT_OTHER)
Admission: EM | Admit: 2018-05-18 | Discharge: 2018-05-18 | Disposition: A | Discharge status: Home / Self Care | Payer: Medicaid Other | Source: Home / Self Care

## 2018-05-18 ENCOUNTER — Emergency Department (HOSPITAL_COMMUNITY)
Admission: EM | Admit: 2018-05-18 | Discharge: 2018-05-18 | Disposition: A | Discharge status: Home / Self Care | Payer: Medicaid Other | Attending: Emergency Medicine | Admitting: Emergency Medicine

## 2018-05-18 ENCOUNTER — Encounter (HOSPITAL_COMMUNITY): Payer: Self-pay | Admitting: *Deleted

## 2018-05-18 DIAGNOSIS — R7989 Other specified abnormal findings of blood chemistry: Secondary | ICD-10-CM

## 2018-05-18 DIAGNOSIS — R079 Chest pain, unspecified: Secondary | ICD-10-CM | POA: Insufficient documentation

## 2018-05-18 DIAGNOSIS — R2242 Localized swelling, mass and lump, left lower limb: Secondary | ICD-10-CM | POA: Insufficient documentation

## 2018-05-18 DIAGNOSIS — Z79899 Other long term (current) drug therapy: Secondary | ICD-10-CM | POA: Diagnosis not present

## 2018-05-18 DIAGNOSIS — Z7982 Long term (current) use of aspirin: Secondary | ICD-10-CM | POA: Insufficient documentation

## 2018-05-18 DIAGNOSIS — Z87891 Personal history of nicotine dependence: Secondary | ICD-10-CM | POA: Diagnosis not present

## 2018-05-18 DIAGNOSIS — R609 Edema, unspecified: Secondary | ICD-10-CM | POA: Diagnosis not present

## 2018-05-18 LAB — CBC WITH DIFFERENTIAL/PLATELET
Abs Immature Granulocytes: 0.05 10*3/uL (ref 0.00–0.07)
BASOS ABS: 0 10*3/uL (ref 0.0–0.1)
Basophils Relative: 0 %
EOS PCT: 0 %
Eosinophils Absolute: 0 10*3/uL (ref 0.0–0.5)
HCT: 40.1 % (ref 36.0–46.0)
Hemoglobin: 13.5 g/dL (ref 12.0–15.0)
Immature Granulocytes: 1 %
LYMPHS ABS: 1.6 10*3/uL (ref 0.7–4.0)
LYMPHS PCT: 19 %
MCH: 29.3 pg (ref 26.0–34.0)
MCHC: 33.7 g/dL (ref 30.0–36.0)
MCV: 87 fL (ref 80.0–100.0)
MONO ABS: 0.9 10*3/uL (ref 0.1–1.0)
Monocytes Relative: 11 %
NRBC: 0 % (ref 0.0–0.2)
Neutro Abs: 5.7 10*3/uL (ref 1.7–7.7)
Neutrophils Relative %: 69 %
Platelets: 282 10*3/uL (ref 150–400)
RBC: 4.61 MIL/uL (ref 3.87–5.11)
RDW: 13 % (ref 11.5–15.5)
WBC: 8.2 10*3/uL (ref 4.0–10.5)

## 2018-05-18 LAB — BASIC METABOLIC PANEL
Anion gap: 9 (ref 5–15)
BUN: 12 mg/dL (ref 6–20)
CALCIUM: 9.3 mg/dL (ref 8.9–10.3)
CO2: 25 mmol/L (ref 22–32)
CREATININE: 0.81 mg/dL (ref 0.44–1.00)
Chloride: 105 mmol/L (ref 98–111)
GFR calc non Af Amer: >60 mL/min (ref >60)
Glucose, Bld: 108 mg/dL — ABNORMAL HIGH (ref 70–99)
Potassium: 3.5 mmol/L (ref 3.5–5.1)
Sodium: 139 mmol/L (ref 135–145)

## 2018-05-18 LAB — I-STAT BETA HCG BLOOD, ED (MC, WL, AP ONLY): I-stat hCG, quantitative: <5 m[IU]/mL (ref <5)

## 2018-05-18 LAB — I-STAT TROPONIN, ED: TROPONIN I, POC: 0 ng/mL (ref 0.00–0.08)

## 2018-05-18 MED ORDER — SODIUM CHLORIDE 0.9 % IV BOLUS
1000.0000 mL | Freq: Once | INTRAVENOUS | Status: AC
Start: 2018-05-18 — End: 2018-05-18
  Administered 2018-05-18: 1000 mL via INTRAVENOUS

## 2018-05-18 MED ORDER — SODIUM CHLORIDE (PF) 0.9 % IJ SOLN
INTRAMUSCULAR | Status: AC
  Filled 2018-05-18: qty 50

## 2018-05-18 MED ORDER — IOPAMIDOL (ISOVUE-370) INJECTION 76%
100.0000 mL | Freq: Once | INTRAVENOUS | Status: AC | PRN
  Administered 2018-05-18: 100 mL via INTRAVENOUS

## 2018-05-18 MED ORDER — IOPAMIDOL (ISOVUE-370) INJECTION 76%
INTRAVENOUS | Status: AC
  Filled 2018-05-18: qty 100

## 2018-05-18 NOTE — Progress Notes (Signed)
Bilateral lower extremity venous duplex completed. preliminary results given to [place]. Full preliminary results found in chart review CV Proc. [place] Brayah Urquilla,RVS 05/18/2018, 2:42 PM

## 2018-05-18 NOTE — ED Triage Notes (Signed)
Pt states she received a call from her doctor's office to come to ED immediately. Reported to have elevated D-dimer. Pt has  A strong family history of CAD.

## 2018-05-18 NOTE — ED Provider Notes (Signed)
[facility] DEPT Provider Note   CSN: [id number] Arrival date & time: 05/18/18  0944     History   Chief Complaint Chief Complaint  Patient presents with  . Elevated D Dimer    HPI Chelsea Cherry is a 48 y.o. female with a past medical history of fibromyalgia, aches, IBS, anxiety, depression, who presents today for evaluation of elevated d-dimer.  She reports that approximately 10 days ago she had an episode of shortness of breath that lasted for about 15 minutes and was associated with severe chest pain.  She went to her primary care doctor, at [facility] who ordered multiple tests.  Her d-dimer was reportedly elevated over 1 and she received a call today telling her to come to the emergency room.  Currently she reports that she feels palpitations which is not new for her, she is followed by cardiology for this.  She reports no change in her chest, legs, or body.  She has never had a blood clot before.  She denies any estrogen use.  She states she has not had a menstrual cycle since September.    She reports that she is at her baseline for shortness of breath.  He has bilateral leg swelling, however this is not worse than usual.  HPI  Past Medical History:  Diagnosis Date  . Anxiety   . Chronic headaches   . Colon polyps   . Depression   . Fibromyalgia   . Hyperlipidemia   . IBS (irritable bowel syndrome)   . Mitral valve prolapse   . SVT (supraventricular tachycardia) ([place])   . UTI (urinary tract infection)     Patient Active Problem List   Diagnosis Date Noted  . MDD (major depressive disorder), recurrent episode, moderate ([place]) 04/17/2017    Past Surgical History:  Procedure Laterality Date  . abdominal laproscopic surgery    . BREAST BIOPSY Right   . BREAST EXCISIONAL BIOPSY Right    x3  . BREAST EXCISIONAL BIOPSY Left   . FOOT SURGERY    . TUBAL LIGATION    . WISDOM TOOTH EXTRACTION       OB History   No  obstetric history on file.      Home Medications    Prior to Admission medications   Medication Sig Start Date End Date Taking? Authorizing Provider  acetaminophen-codeine (TYLENOL #3) 300-30 MG tablet Take 1 tablet by mouth See admin instructions. Take 1 tablet by mouth every 4 hours and 2 tablets at bedtime   Yes [provider]  aspirin EC 81 MG tablet Take 81 mg by mouth daily.   Yes [provider]  celecoxib (CELEBREX) 200 MG capsule Take 200 mg by mouth daily.   Yes [provider]  dexlansoprazole (DEXILANT) 60 MG capsule Take 60 mg by mouth daily.   Yes [provider]  DULoxetine (CYMBALTA) 60 MG capsule Take 2 capsules (120 mg total) by mouth daily. Patient taking differently: Take 120 mg by mouth at bedtime.  09/14/17  Yes Hisada, Elie Goody, MD  gabapentin (NEURONTIN) 300 MG capsule Take 300-1,200 mg by mouth See admin instructions. Take 300 mg by mouth three times daily and then take 1200 mg by mouth at bedtime   Yes [provider]  glucosamine-chondroitin 500-400 MG tablet Take 1 tablet by mouth 2 (two) times daily.    Yes [provider]  levothyroxine (SYNTHROID, LEVOTHROID) 50 MCG tablet Take 50 mcg by mouth daily before breakfast.  Yes [provider]  lisdexamfetamine (VYVANSE) 60 MG capsule Take 60 mg by mouth every morning.   Yes [provider]  loratadine (CLARITIN) 10 MG tablet Take 10 mg by mouth daily.   Yes [provider]  metFORMIN (GLUCOPHAGE) 500 MG tablet Take 500 mg by mouth every evening.   Yes [provider]  metoprolol succinate (TOPROL-XL) 50 MG 24 hr tablet Take one and one-half tablet by mouth daily.Take with or immediately following a meal. Patient taking differently: Take 75 mg by mouth daily. Take with or immediately following a meal. 05/07/17  Yes Fay Records, MD  mirabegron ER (MYRBETRIQ) 50 MG TB24 tablet Take 50 mg by mouth daily.   Yes [provider]   mirtazapine (REMERON) 15 MG tablet Take 15 mg by mouth at bedtime.   Yes [provider]  Multiple Vitamin (MULTIVITAMIN) tablet Take 1 tablet by mouth daily.   Yes [provider]  Omega-3 Fatty Acids (FISH OIL) 1200 MG CAPS Take 1,200 mg by mouth 2 (two) times daily.   Yes [provider]  pravastatin (PRAVACHOL) 20 MG tablet Take 20 mg by mouth every evening.   Yes [provider]  SUMAtriptan (IMITREX) 50 MG tablet Take 50 mg by mouth every 2 (two) hours as needed for migraine. May repeat in 2 hours if headache persists or recurs.   Yes [provider]  gabapentin (NEURONTIN) 100 MG capsule Take 1 capsule (100 mg total) by mouth 3 (three) times daily. Patient not taking: Reported on 05/18/2018 08/17/17   Charlett Blake, MD  gabapentin (NEURONTIN) 600 MG tablet Take 2 tablets (1,200 mg total) by mouth at bedtime. Patient not taking: Reported on 05/18/2018 08/17/17   Charlett Blake, MD  lamoTRIgine (LAMICTAL) 150 MG tablet Take 1 tablet (150 mg total) by mouth daily. Patient not taking: Reported on 05/18/2018 09/15/17   Norman Clay, MD  omeprazole (PRILOSEC) 20 MG capsule Take 1 capsule (20 mg total) by mouth daily. Patient not taking: Reported on 05/18/2018 06/02/17   Levin Erp, PA    Family History Family History  Problem Relation Age of Onset  . Fibromyalgia Mother   . Depression Mother   . Anxiety disorder Mother   . Alcohol abuse Maternal Uncle   . Alcohol abuse Maternal Grandmother   . Ovarian cancer Maternal Grandmother   . Bladder Cancer Maternal Grandmother     Social History Social History   Tobacco Use  . Smoking status: Former Research scientist (life sciences)  . Smokeless tobacco: Never Used  Substance Use Topics  . Alcohol use: Yes    Alcohol/week: 1.0 standard drinks    Types: 1 Standard drinks or equivalent per week    Comment: 3-4 week  . Drug use: Never     Allergies   Patient has no known allergies.   Review of  Systems Review of Systems  Constitutional: Negative for chills and fever.  Eyes: Negative for visual disturbance.  Respiratory: Positive for shortness of breath (Unchanged from baseline).   Cardiovascular: Positive for chest pain (Unchanged from baseline), palpitations and leg swelling.  Gastrointestinal: Negative for abdominal pain, diarrhea, nausea and vomiting.  Genitourinary: Negative for dysuria and urgency.  Neurological: Negative for weakness and headaches.  All other systems reviewed and are negative.    Physical Exam Updated Vital Signs BP 118/77   Pulse 64   Temp 97.8 F (36.6 C) (Oral)   Resp 17   Ht 5\' 7"  (1.702 m)   Wt  100.2 kg   SpO2 96%   BMI 34.61 kg/m   Physical Exam Vitals signs and nursing note reviewed.  Constitutional:      General: She is not in acute distress.    Appearance: She is well-developed and normal weight.  HENT:     Head: Normocephalic and atraumatic.     Mouth/Throat:     Mouth: Mucous membranes are moist.  Eyes:     Conjunctiva/sclera: Conjunctivae normal.  Neck:     Musculoskeletal: Normal range of motion and neck supple.  Cardiovascular:     Rate and Rhythm: Normal rate. Rhythm irregular.     Pulses: Normal pulses.     Heart sounds: Normal heart sounds. No murmur.  Pulmonary:     Effort: Pulmonary effort is normal. No respiratory distress.     Breath sounds: Normal breath sounds.  Abdominal:     General: Abdomen is flat. Bowel sounds are normal. There is no distension.     Palpations: Abdomen is soft.     Tenderness: There is no abdominal tenderness.  Musculoskeletal: Normal range of motion.     Right lower leg: Edema present.     Left lower leg: Edema present.     Comments: Nonpitting edema to symmetrical bilateral lower legs.  Skin:    General: Skin is warm and dry.  Neurological:     General: No focal deficit present.     Mental Status: She is alert.  Psychiatric:        Mood and Affect: Mood normal.        Behavior:  Behavior normal.      ED Treatments / Results  Labs (all labs ordered are listed, but only abnormal results are displayed) Labs Reviewed  BASIC METABOLIC PANEL - Abnormal; Notable for the following components:      Result Value   Glucose, Bld 108 (*)    All other components within normal limits  CBC WITH DIFFERENTIAL/PLATELET  I-STAT BETA HCG BLOOD, ED ([facility], [facility], [facility] ONLY)  I-STAT TROPONIN, ED    EKG EKG Interpretation  Date/Time:  Tuesday May 18 2018 09:59:20 EST Ventricular Rate:  71 PR Interval:    QRS Duration: 91 QT Interval:  368 QTC Calculation: 400 R Axis:   55 Text Interpretation:  Sinus rhythm Ventricular premature complex Low voltage, precordial leads Baseline wander in lead(s) III similar to prior 7/19 Confirmed by Aletta Edouard [phone]) on 05/18/2018 10:40:36 AM   Radiology Ct Angio Chest Pe W/cm &/or Wo Cm  Result Date: 05/18/2018 CLINICAL DATA:  48 year old female with dyspnea and positive D-dimer. EXAM: CT ANGIOGRAPHY CHEST WITH CONTRAST TECHNIQUE: Multidetector CT imaging of the chest was performed using the standard protocol during bolus administration of intravenous contrast. Multiplanar CT image reconstructions and MIPs were obtained to evaluate the vascular anatomy. CONTRAST:  145mL ISOVUE-370 IOPAMIDOL (ISOVUE-370) INJECTION 76% COMPARISON:  None. FINDINGS: Cardiovascular: Adequate opacification of the pulmonary arteries to the proximal segmental level. No evidence of central filling defect to suggest acute pulmonary embolus. The aorta is normal in caliber. No evidence of dissection. The heart is normal in size. No pericardial effusion. Mediastinum/Nodes: Unremarkable CT appearance of the thyroid gland. No suspicious mediastinal or hilar adenopathy. No soft tissue mediastinal mass. The thoracic esophagus is unremarkable. Lungs/Pleura: Lungs are clear. No pleural effusion or pneumothorax. Upper Abdomen: No acute abnormality. Musculoskeletal: No chest wall  abnormality. No acute or significant osseous findings. Review of the MIP images confirms the above findings. IMPRESSION: Negative for acute pulmonary embolus,  pneumonia or other acute cardiopulmonary process. Electronically Signed   By: Jacqulynn Cadet M.D.   On: 05/18/2018 13:00   Vas [place] Lower Extremity Venous (dvt) (only Mc & Wl 7a-7p)  Result Date: 05/18/2018  Lower Venous Study Indications: Edema, and Positive d-Dimer.  Performing Technologist: Toma Copier RVS  Examination Guidelines: A complete evaluation includes B-mode imaging, spectral Doppler, color Doppler, and power Doppler as needed of all accessible portions of each vessel. Bilateral testing is considered an integral part of a complete examination. Limited examinations for reoccurring indications may be performed as noted.  Right Venous Findings: +---------+---------------+---------+-----------+----------+--------------+          CompressibilityPhasicitySpontaneityPropertiesSummary        +---------+---------------+---------+-----------+----------+--------------+ CFV      Full           Yes      Yes                                 +---------+---------------+---------+-----------+----------+--------------+ SFJ      Full                                                        +---------+---------------+---------+-----------+----------+--------------+ FV Prox  Full           Yes      Yes                                 +---------+---------------+---------+-----------+----------+--------------+ FV Mid   Full                                                        +---------+---------------+---------+-----------+----------+--------------+ FV DistalFull           Yes      Yes                                 +---------+---------------+---------+-----------+----------+--------------+ PFV      Full           Yes      Yes                                  +---------+---------------+---------+-----------+----------+--------------+ POP      Full           Yes      Yes                                 +---------+---------------+---------+-----------+----------+--------------+ PTV      Full                                                        +---------+---------------+---------+-----------+----------+--------------+ PERO  Not visualized +---------+---------------+---------+-----------+----------+--------------+  Left Venous Findings: +---------+---------------+---------+-----------+----------+-------+          CompressibilityPhasicitySpontaneityPropertiesSummary +---------+---------------+---------+-----------+----------+-------+ CFV      Full           Yes      Yes                          +---------+---------------+---------+-----------+----------+-------+ SFJ      Full                                                 +---------+---------------+---------+-----------+----------+-------+ FV Prox  Full           Yes      Yes                          +---------+---------------+---------+-----------+----------+-------+ FV Mid   Full                                                 +---------+---------------+---------+-----------+----------+-------+ FV DistalFull           Yes      Yes                          +---------+---------------+---------+-----------+----------+-------+ PFV      Full           Yes      Yes                          +---------+---------------+---------+-----------+----------+-------+ POP      Full           Yes      Yes                          +---------+---------------+---------+-----------+----------+-------+ PTV      Full                                                 +---------+---------------+---------+-----------+----------+-------+ PERO     Full                                                  +---------+---------------+---------+-----------+----------+-------+    Summary: Right: There is no evidence of deep vein thrombosis in the lower extremity. No cystic structure found in the popliteal fossa. Left: There is no evidence of deep vein thrombosis in the lower extremity. No cystic structure found in the popliteal fossa.  *See table(s) above for measurements and observations.    Preliminary     Procedures Procedures (including critical care time)  Medications Ordered in ED Medications  sodium chloride (PF) 0.9 % injection (has no administration in time range)  iopamidol (ISOVUE-370) 76 % injection (has no administration in time range)  sodium chloride 0.9 % bolus 1,000 mL (0 mLs Intravenous Stopped 05/18/18 1414)  iopamidol (ISOVUE-370) 76 %  injection 100 mL (100 mLs Intravenous Contrast Given 05/18/18 1237)     Initial Impression / Assessment and Plan / ED Course  I have reviewed the triage vital signs and the nursing notes.  Pertinent labs & imaging results that were available during my care of the patient were reviewed by me and considered in my medical decision making (see chart for details).  Clinical Course as of May 18 1537  Tue May 18, 2018  1436 DVT study is negative.   [EH]    Clinical Course User Index [EH] Lorin Glass, PA-C   Patient presents today for evaluation of an elevated d-dimer from her primary care doctor.  D-dimer was drawn after she had a episode of chest pain and shortness of breath last week.  At the time of my evaluation she denies any new symptoms.  She does have palpitations that she can feel, however this is not new when she is currently followed by cardiology for this.  Troponin and EKG are without evidence of acute ischemia.  She is not currently having any chest pain.  BMP and CBC are both without significant electrolyte or hematologic derangements.  Pregnancy test is negative.  She is given 1 L of IV fluids as she takes metformin.  She was  instructed not to take her metformin for the next 3 days.  CTA PE study was performed without evidence of PE or other abnormality.  With elevated d-dimer and bilateral leg swelling DVT study was performed without evidence of blood clot.    Recommended PCP follow-up.  Discharged home.  Return precautions were discussed with patient who states their understanding.  At the time of discharge patient denied any unaddressed complaints or concerns.  Patient is agreeable for discharge home.   Final Clinical Impressions(s) / ED Diagnoses   Final diagnoses:  Elevated d-dimer    ED Discharge Orders    None       Ollen Gross 05/18/18 1628    Hayden Rasmussen, MD 05/18/18 [phone]

## 2018-05-18 NOTE — ED Notes (Signed)
She c/o multiple aches "all the time every day", but denies any change in the aching, including bilat. Legs. She denies chest pain now, also.

## 2018-05-18 NOTE — Discharge Instructions (Addendum)
Today your CT scan on your chest and the ultrasound of your legs does not show any evidence of a blood clot.  Please follow back up with your primary care doctor.  Your labs were normal.  As we discussed earlier please do not take your metformin for the next 3 days as you had contrast through your IV.

## 2018-05-18 NOTE — ED Notes (Signed)
She remains in no distress. Her significant other remains with her.

## 2018-05-23 ENCOUNTER — Ambulatory Visit
Admission: RE | Admit: 2018-05-23 | Discharge: 2018-05-23 | Disposition: A | Discharge status: Home / Self Care | Payer: Medicaid Other | Source: Ambulatory Visit | Attending: Nurse Practitioner | Admitting: Nurse Practitioner

## 2018-05-23 DIAGNOSIS — M545 Low back pain, unspecified: Secondary | ICD-10-CM

## 2018-06-01 ENCOUNTER — Encounter: Payer: Self-pay | Admitting: *Deleted

## 2018-06-02 ENCOUNTER — Encounter

## 2018-06-02 ENCOUNTER — Encounter: Payer: Self-pay | Admitting: Diagnostic Neuroimaging

## 2018-06-02 ENCOUNTER — Ambulatory Visit: Payer: Medicaid Other | Admitting: Diagnostic Neuroimaging

## 2018-06-02 VITALS — BP 112/86 | HR 76 | Ht 67.0 in | Wt 227.8 lb

## 2018-06-02 DIAGNOSIS — R42 Dizziness and giddiness: Secondary | ICD-10-CM | POA: Diagnosis not present

## 2018-06-02 NOTE — Patient Instructions (Signed)
DIZZINESS / LIGHTHEADEDNESS - non-specific; neuro exam and MRI brain unremarkable - supportive care; follow up with PCP and cardiology

## 2018-06-02 NOTE — Progress Notes (Signed)
[facility]  PATIENT: Chelsea Cherry DOB: [birth year]  REFERRING CLINICIAN: Simona Huh HISTORY FROM: patient  REASON FOR VISIT: new consult    HISTORICAL  CHIEF COMPLAINT:  Chief Complaint  Patient presents with  . New Patient (Initial Visit)    Referred [facility], Simona Huh NP,  Rm 6, alone  . Arm numbness/ post concussive syndrome.    Pt had episode of arm numbness, Has had MRI head, neck [facility], [facility].      HISTORY OF PRESENT ILLNESS:   48 year old female here for evaluation of dizziness.  I reviewed referring notes from PCP, which mention dizziness on the referral sheet, but I do not see any discussion of this in the office notes.  Patient reports having numbness after falling asleep in her adjustable bed, which was then adjusted by her husband.  When she woke up she felt numbness in her fingers and hands for several minutes.  Patient is not sure when this happened, but review of care everywhere notes and results, I see MRI cervical spine from 04/24/2018, with indication mentioning neck pain radiating to the arms and shoulders.  However patient is more interested in her MRI brain results.  Patient tells me that she does not know what these results show and expected to come here for this visit to find out the results.  I found MRI brain result from 02/18/2018, with indication of chronic headaches, blurred vision, dizziness.  Few nonspecific white matter hyperintensities were noted, likely chronic small vessel ischemic changes versus migraine changes.  I reviewed these with the patient.  No evidence of demyelinating disease or multiple sclerosis based on this report.  Today patient feels well.  No headache or dizziness at this time.  She has had some mild lightheadedness at times but not currently.   REVIEW OF SYSTEMS: Full 14 system review of systems performed and negative with exception of: Insomnia history of headaches snoring racing thoughts  joint pain shortness of breath palpitations blurred vision fatigue weight gain.   ALLERGIES: No Known Allergies  HOME MEDICATIONS: Outpatient Medications Prior to Visit  Medication Sig Dispense Refill  . acetaminophen-codeine (TYLENOL #3) 300-30 MG tablet Take 1 tablet by mouth See admin instructions. Take 1 tablet by mouth every 4 hours and 2 tablets at bedtime    . aspirin EC 81 MG tablet Take 81 mg by mouth daily.    . celecoxib (CELEBREX) 200 MG capsule Take 200 mg by mouth daily.    Marland Kitchen dexlansoprazole (DEXILANT) 60 MG capsule Take 60 mg by mouth daily.    . DULoxetine (CYMBALTA) 60 MG capsule Take 2 capsules (120 mg total) by mouth daily. (Patient taking differently: Take 120 mg by mouth at bedtime. ) 180 capsule 0  . gabapentin (NEURONTIN) 300 MG capsule Take 300-1,200 mg by mouth See admin instructions. Take 300 mg by mouth three times daily and then take 1200 mg by mouth at bedtime    . glucosamine-chondroitin 500-400 MG tablet Take 1 tablet by mouth 2 (two) times daily.     Marland Kitchen levothyroxine (SYNTHROID, LEVOTHROID) 50 MCG tablet Take 50 mcg by mouth daily before breakfast.    . lisdexamfetamine (VYVANSE) 60 MG capsule Take 60 mg by mouth every morning.    . loratadine (CLARITIN) 10 MG tablet Take 10 mg by mouth daily.    . metFORMIN (GLUCOPHAGE) 500 MG tablet Take 500 mg by mouth every evening.    . metoprolol succinate (TOPROL-XL) 50 MG 24 hr tablet Take one  and one-half tablet by mouth daily.Take with or immediately following a meal. (Patient taking differently: Take 75 mg by mouth daily. Take with or immediately following a meal.) 45 tablet 11  . mirabegron ER (MYRBETRIQ) 50 MG TB24 tablet Take 50 mg by mouth daily.    . mirtazapine (REMERON) 15 MG tablet Take 15 mg by mouth at bedtime.    . Multiple Vitamin (MULTIVITAMIN) tablet Take 1 tablet by mouth daily.    . Omega-3 Fatty Acids (FISH OIL) 1200 MG CAPS Take 1,200 mg by mouth 2 (two) times daily.    . pravastatin (PRAVACHOL) 20  MG tablet Take 20 mg by mouth every evening.    . SUMAtriptan (IMITREX) 50 MG tablet Take 50 mg by mouth every 2 (two) hours as needed for migraine. May repeat in 2 hours if headache persists or recurs.    . gabapentin (NEURONTIN) 100 MG capsule Take 1 capsule (100 mg total) by mouth 3 (three) times daily. (Patient not taking: Reported on 05/18/2018) 90 capsule 1  . gabapentin (NEURONTIN) 600 MG tablet Take 2 tablets (1,200 mg total) by mouth at bedtime. (Patient not taking: Reported on 05/18/2018) 60 tablet 1  . lamoTRIgine (LAMICTAL) 150 MG tablet Take 1 tablet (150 mg total) by mouth daily. (Patient not taking: Reported on 05/18/2018) 90 tablet 0  . omeprazole (PRILOSEC) 20 MG capsule Take 1 capsule (20 mg total) by mouth daily. (Patient not taking: Reported on 05/18/2018) 90 capsule 3   No facility-administered medications prior to visit.     PAST MEDICAL HISTORY: Past Medical History:  Diagnosis Date  . Anxiety   . Chronic headaches   . Colon polyps   . Depression   . Fibromyalgia   . Hyperlipidemia   . IBS (irritable bowel syndrome)   . Mitral valve prolapse   . SVT (supraventricular tachycardia) ([place])   . UTI (urinary tract infection)   . Vitamin D deficiency     PAST SURGICAL HISTORY: Past Surgical History:  Procedure Laterality Date  . abdominal laproscopic surgery    . BREAST BIOPSY Right   . BREAST EXCISIONAL BIOPSY Right    x3  . BREAST EXCISIONAL BIOPSY Left   . FOOT SURGERY    . TUBAL LIGATION    . WISDOM TOOTH EXTRACTION      FAMILY HISTORY: Family History  Problem Relation Age of Onset  . Fibromyalgia Mother   . Depression Mother   . Anxiety disorder Mother   . Heart disease Mother   . Alcohol abuse Maternal Uncle   . Alcohol abuse Maternal Grandmother   . Ovarian cancer Maternal Grandmother   . Bladder Cancer Maternal Grandmother     SOCIAL HISTORY: Social History   Socioeconomic History  . Marital status: Single    Spouse name: Not on file  .  Number of children: Not on file  . Years of education: Not on file  . Highest education level: Not on file  Occupational History  . Not on file  Social Needs  . Financial resource strain: Not on file  . Food insecurity:    Worry: Not on file    Inability: Not on file  . Transportation needs:    Medical: Not on file    Non-medical: Not on file  Tobacco Use  . Smoking status: Former Smoker    Last attempt to quit: 01/03/2007    Years since quitting: 11.4  . Smokeless tobacco: Never Used  Substance and Sexual Activity  . Alcohol use:  Yes    Alcohol/week: 1.0 standard drinks    Types: 1 Standard drinks or equivalent per week    Comment: 3-4 week  . Drug use: Never  . Sexual activity: Not on file  Lifestyle  . Physical activity:    Days per week: Not on file    Minutes per session: Not on file  . Stress: Not on file  Relationships  . Social connections:    Talks on phone: Not on file    Gets together: Not on file    Attends religious service: Not on file    Active member of club or organization: Not on file    Attends meetings of clubs or organizations: Not on file    Relationship status: Not on file  . Intimate partner violence:    Fear of current or ex partner: Not on file    Emotionally abused: Not on file    Physically abused: Not on file    Forced sexual activity: Not on file  Other Topics Concern  . Not on file  Social History Narrative   Lives home with Lenell Antu, Husband.  LilaRose-daughter.  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  No caffeine since 33yrs old.     PHYSICAL EXAM  GENERAL EXAM/CONSTITUTIONAL: Vitals:  Vitals:   06/02/18 0855  BP: 112/86  Pulse: 76  Weight: 227 lb 12.8 oz (103.3 kg)  Height: 5\' 7"  (1.702 m)     Body mass index is 35.68 kg/m. Wt Readings from Last 3 Encounters:  06/02/18 227 lb 12.8 oz (103.3 kg)  05/18/18 221 lb (100.2 kg)  10/12/17 230 lb (104.3 kg)     Patient is in no distress; well developed, nourished and groomed;  neck is supple  CARDIOVASCULAR:  Examination of carotid arteries is normal; no carotid bruits  Regular rate and rhythm, no murmurs  Examination of peripheral vascular system by observation and palpation is normal  EYES:  Ophthalmoscopic exam of optic discs and posterior segments is normal; no papilledema or hemorrhages  Visual Acuity Screening   Right eye Left eye Both eyes  Without correction:     With correction: 20/40 20/40      MUSCULOSKELETAL:  Gait, strength, tone, movements noted in Neurologic exam below  NEUROLOGIC: MENTAL STATUS:  No flowsheet data found.  awake, alert, oriented to person, place and time  recent and remote memory intact  normal attention and concentration  language fluent, comprehension intact, naming intact  fund of knowledge appropriate  CRANIAL NERVE:   2nd - no papilledema on fundoscopic exam  2nd, 3rd, 4th, 6th - pupils equal and reactive to light, visual fields full to confrontation, extraocular muscles intact, no nystagmus  5th - facial sensation symmetric  7th - facial strength symmetric  8th - hearing intact  9th - palate elevates symmetrically, uvula midline  11th - shoulder shrug symmetric  12th - tongue protrusion midline  MOTOR:   normal bulk and tone, full strength in the BUE, BLE  SENSORY:   normal and symmetric to light touch, temperature, vibration  COORDINATION:   finger-nose-finger, fine finger movements normal  REFLEXES:   deep tendon reflexes present and symmetric  GAIT/STATION:   narrow based gait     DIAGNOSTIC DATA (LABS, IMAGING, TESTING) - I reviewed patient records, labs, notes, testing and imaging myself where available.  Lab Results  Component Value Date   WBC 8.2 05/18/2018   HGB 13.5 05/18/2018   HCT 40.1 05/18/2018   MCV 87.0 05/18/2018  PLT 282 05/18/2018      Component Value Date/Time   NA 139 05/18/2018 1052   K 3.5 05/18/2018 1052   CL 105 05/18/2018 1052   CO2  25 05/18/2018 1052   GLUCOSE 108 (H) 05/18/2018 1052   BUN 12 05/18/2018 1052   CREATININE 0.81 05/18/2018 1052   CALCIUM 9.3 05/18/2018 1052   GFRNONAA >60 05/18/2018 1052   GFRAA >60 05/18/2018 1052   No results found for: CHOL, HDL, LDLCALC, LDLDIRECT, TRIG, CHOLHDL No results found for: HGBA1C No results found for: VITAMINB12 No results found for: TSH   05/23/18 MRI lumbar spine [I reviewed images myself and agree with interpretation. -VRP]  1. Trace anterolisthesis of L4 on L5 with associated advanced facet arthropathy, with resultant mild bilateral lateral recess stenosis. No frank impingement. Facet disease at this level could contribute to lower back pain. 2. Additional mild degenerative disc disease and facet hypertrophy at L3-4 and L5-S1 without significant stenosis or neural impingement.  04/24/18 MRI cervical spine  1. Cervical spine disc and endplate degeneration at C5-C6. Capacious underlying spinal canal with no spinal stenosis. Mild to moderate right C6 foraminal stenosis. 2. Subtle left foraminal disc bulge at C3-C4 with mild if any left C4 foraminal stenosis.  02/18/18 MRI brain  1. No acute intracranial abnormality. 2. Few nonspecific white matter hyperintensities in bifrontal subcortical white matter. Finding may represent mild chronic microvascular ischemic changes and are typical of migraine headache. No specific findings for demyelination. 3. Partial left mastoid opacification.    ASSESSMENT AND PLAN  48 y.o. year old female here with:  Dx:  1. Dizziness     PLAN:  DIZZINESS / LIGHTHEADEDNESS - non-specific; neuro exam and MRI brain unremarkable - supportive care; follow up with PCP and cardiology  Return for return to PCP.    Penni Bombard, MD 6/43/3295, 1:88 AM Certified in Neurology, Neurophysiology and Neuroimaging  [facility] [address], [place], [place] [address] [phone]

## 2018-09-21 ENCOUNTER — Telehealth: Payer: Self-pay | Admitting: *Deleted

## 2018-09-21 NOTE — Telephone Encounter (Signed)
Called patient and advised her that due to current COVID 19 pandemic, our office is severely reducing in person visits in order to minimize the risk to our patients and healthcare providers. We recommend to convert your appointment to a video visit. We'll take all precautions to reduce any security or privacy concerns. This will be treated like an office visit, and we will file with your insurance. She consented to video visit. Her e mail: [email or web address]. Updated EMR. She will bring MRI disks to office for Dr Leta Baptist to review. She verbalized understanding, appreciation.

## 2018-09-28 ENCOUNTER — Institutional Professional Consult (permissible substitution): Payer: Medicaid Other | Admitting: Diagnostic Neuroimaging

## 2018-10-12 ENCOUNTER — Ambulatory Visit: Payer: Medicaid Other | Admitting: Diagnostic Neuroimaging

## 2018-10-12 ENCOUNTER — Encounter: Payer: Self-pay | Admitting: Diagnostic Neuroimaging

## 2018-10-12 ENCOUNTER — Other Ambulatory Visit: Payer: Self-pay

## 2018-10-12 VITALS — BP 121/70 | HR 75 | Ht 67.0 in | Wt 217.6 lb

## 2018-10-12 DIAGNOSIS — G8929 Other chronic pain: Secondary | ICD-10-CM

## 2018-10-12 DIAGNOSIS — M5441 Lumbago with sciatica, right side: Secondary | ICD-10-CM

## 2018-10-12 DIAGNOSIS — M5442 Lumbago with sciatica, left side: Secondary | ICD-10-CM

## 2018-10-12 DIAGNOSIS — G894 Chronic pain syndrome: Secondary | ICD-10-CM
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
## 2018-10-21 ENCOUNTER — Other Ambulatory Visit: Payer: Self-pay | Admitting: Nurse Practitioner

## 2018-10-21 DIAGNOSIS — Z1231 Encounter for screening mammogram for malignant neoplasm of breast: Secondary | ICD-10-CM

## 2018-11-03 ENCOUNTER — Other Ambulatory Visit: Payer: Self-pay

## 2018-11-03 ENCOUNTER — Ambulatory Visit: Payer: Medicaid Other | Attending: Nurse Practitioner | Admitting: Physical Therapy

## 2018-11-03 ENCOUNTER — Encounter: Payer: Self-pay | Admitting: Physical Therapy

## 2018-11-03 DIAGNOSIS — G8929 Other chronic pain: Secondary | ICD-10-CM | POA: Insufficient documentation

## 2018-11-03 DIAGNOSIS — M5441 Lumbago with sciatica, right side: Secondary | ICD-10-CM | POA: Insufficient documentation

## 2018-11-03 DIAGNOSIS — M5442 Lumbago with sciatica, left side: Secondary | ICD-10-CM | POA: Diagnosis present

## 2018-11-03 NOTE — Patient Instructions (Addendum)
Access Code: [email or web address]  URL: [email or web address]  Date: 11/03/2018  Prepared by: Elsie Ra   Exercises  Supine Single Knee to Chest - 3 sets - 30 hold - 2x daily - 6x weekly  Supine March - 10 reps - 1-2 sets - 2x daily - 6x weekly  Beginner Bridge - 10 reps - 3 sets - 2x daily - 6x weekly  Seated Lumbar Flexion Stretch - 10-20 reps - 1 sets - 5 hold - 2x daily - 6x weekly  Seated Hamstring Stretch - 3 sets - 30 hold - 2x daily - 6x weekly    TENS UNIT: This is helpful for muscle pain and spasm.   Search and Purchase a TENS 7000 2nd edition at www.tenspros.com. It should be less than $30.     TENS unit instructions: Do not shower or bathe with the unit on Turn the unit off before removing electrodes or batteries If the electrodes lose stickiness add a drop of water to the electrodes after they are disconnected from the unit and place on plastic sheet. If you continued to have difficulty, call the TENS unit company to purchase more electrodes. Do not apply lotion on the skin area prior to use. Make sure the skin is clean and dry as this will help prolong the life of the electrodes. After use, always check skin for unusual red areas, rash or other skin difficulties. If there are any skin problems, does not apply electrodes to the same area. Never remove the electrodes from the unit by pulling the wires. Do not use the TENS unit or electrodes other than as directed. Do not change electrode placement without consultating your therapist or physician. Keep 2 fingers with between each electrode. Wear time ratio is 2:1, on to off times.    For example on for 30 minutes off for 15 minutes and then on for 30 minutes off for 15 minutes

## 2018-11-03 NOTE — Therapy (Signed)
[place], [place], [address] Phone: [phone]   Fax:  [phone]  Physical Therapy Evaluation  Patient Details  Name: Chelsea Cherry MRN: [id number] Date of Birth: 04-18-1970 Referring Provider (PT): Simona Huh, NP   Encounter Date: 11/03/2018  PT End of Session - 11/03/18 2054    Visit Number  1    Number of Visits  12    Date for PT Re-Evaluation  01/26/19    Authorization Type  MCD    PT Start Time  1615    PT Stop Time  1700    PT Time Calculation (min)  45 min    Activity Tolerance  Patient tolerated treatment well    Behavior During Therapy  [facility] for tasks assessed/performed       Past Medical History:  Diagnosis Date  . Anxiety   . Chronic headaches   . Colon polyps   . Depression   . Fibromyalgia   . Hyperlipidemia   . IBS (irritable bowel syndrome)   . Mitral valve prolapse   . SVT (supraventricular tachycardia) ([place])   . UTI (urinary tract infection)   . Vitamin D deficiency     Past Surgical History:  Procedure Laterality Date  . abdominal laproscopic surgery    . BREAST BIOPSY Right   . BREAST EXCISIONAL BIOPSY Right    x3  . BREAST EXCISIONAL BIOPSY Left   . FOOT SURGERY    . TUBAL LIGATION    . WISDOM TOOTH EXTRACTION      There were no vitals filed for this visit.   Subjective Assessment - 11/03/18 1628    Subjective  She relays Lumbar pain with radiculopathy bilat since 05/31/2007 when she fell on the ice and slid down steps when she was 7 months pregnant. She says she cracked her tailbone then. The pain has become worse over the years and is now constant and bothers her with any activity.    Pertinent History  WLS:LHTDSKA headaches, fibromyalgia,    Limitations  Sitting;Walking;Lifting;Standing    How long can you sit comfortably?  depends    How long can you stand comfortably?  5-10  min    How long can you walk comfortably?  5 min    Diagnostic tests   Lumbar MRI 05/2018 "Trace anterolisthesis of L4 on L5 with associated advanced facetarthropathy, with resultant mild bilateral lateral recess stenosis. Facet disease at this level could contribute to lower back pain. Additional DDD and facet hypertrophy at L3-4 and L5-S1 without stenosis.    Patient Stated Goals  get the pain more managed    Currently in Pain?  Yes    Pain Score  8     Pain Location  Back    Pain Orientation  Right;Left;Lower    Pain Descriptors / Indicators  Sharp    Pain Type  Chronic pain    Pain Radiating Towards  bilat legs    Pain Onset  More than a month ago    Pain Frequency  Constant    Aggravating Factors   any activity more than 5 min    Pain Relieving Factors  pain meds    Effect of Pain on Daily Activities  limits all ADLs         [facility] PT Assessment - 11/03/18 0001      Assessment   Medical Diagnosis  Lumbar pain with radiculopathy    Referring Provider (PT)  Simona Huh, NP  Next MD Visit  This Friday, sees her every 4 weeks    Prior Therapy  not for her back      Precautions   Precautions  None      Restrictions   Weight Bearing Restrictions  No      Balance Screen   Has the patient fallen in the past 6 months  Yes    How many times?  1   had socks on and slipped on her floor     [place] residence    Additional Comments  one flight of stairs and has difficutly this      Prior Function   Level of Independence  Independent    Vocation  Unemployed      Cognition   Overall Cognitive Status  Within Functional Limits for tasks assessed      Observation/Other Assessments   Focus on Therapeutic Outcomes (FOTO)   not done MCD      Sensation   Light Touch  Appears Intact      Coordination   Gross Motor Movements are Fluid and Coordinated  Yes      Posture/Postural Control   Posture Comments  slumped posture      ROM / Strength   AROM / PROM / Strength  AROM;Strength      AROM   Overall  AROM Comments  most pain associated with flexion and ext    AROM Assessment Site  Lumbar    Lumbar Flexion  75%    Lumbar Extension  50%    Lumbar - Right Side Bend  50%    Lumbar - Left Side Bend  50%    Lumbar - Right Rotation  75%    Lumbar - Left Rotation  75%      Strength   Overall Strength Comments  overall leg strength 4+/5 MMT bilat grossly tested in sitting      Flexibility   Soft Tissue Assessment /Muscle Length  --   very tight Hamstrings     Palpation   Palpation comment  very TTP widespread in entire thoracic and lumbar regions      Special Tests   Other special tests  + slump test, + quadrant test, + SLR test, weak core      Transfers   Transfers  Independent with all Transfers      Ambulation/Gait   Gait Comments  WFL                Objective measurements completed on examination: See above findings.      [facility] Adult PT Treatment/Exercise - 11/03/18 0001      Modalities   Modalities  Electrical Stimulation      Electrical Stimulation   Electrical Stimulation Location  lumbar    Electrical Stimulation Action  IFC in sidelying    Electrical Stimulation Parameters  tolerance    Electrical Stimulation Goals  Pain             PT Education - 11/03/18 2103    Education Details  a       PT Short Term Goals - 11/03/18 2104      PT SHORT TERM GOAL #1   Title  Pt will be I and compliant with HEP 4 weeks 12/03/18    Baseline  no HEP until today    Status  New      PT SHORT TERM GOAL #2   Title  Pt will report  2 ways of managing her pain at home other than with pain meds    Baseline  does not know how to manage other than with meds    Status  New        PT Long Term Goals - 11/03/18 2105      PT LONG TERM GOAL #1   Title  Pt will reduce pain overall 50% (target for all LTG 12 weeks 01/26/19)    Baseline  9/10    Status  New      PT LONG TERM GOAL #2   Title  Pt will improve ROM to [facility] to improve mobility.    Baseline  50%  flexion, ext and sidebending for lumbar    Status  New      PT LONG TERM GOAL #3   Title  Pt will improve strength to at least 5-/5 MMT to improve function    Baseline  4+/5 MMT    Status  New      PT LONG TERM GOAL #4   Title  Pt will improve overall activity tolerance with standing and walking to at least 30 minutes for ADL's    Baseline  5 min    Status  New             Plan - 11/03/18 2056    Clinical Impression Statement  Pt presents with signs and symptoms consistent of Chronic LBP with referred pain more than radiculopathy into bilat legs. She has MRI showing Trace anterolisthesis of L4 on L5 with associated advanced facetarthropathy, with resultant mild bilateral lateral recess stenosis. She has overall decreased lumbar ROM, decreased leg strength, decreased activity tolerance particularly with standing or walking, and increased pain limiting her functional abilities. She will benefit from skilled PT to address her deficits.    Personal Factors and Comorbidities  Fitness;Comorbidity 1;Comorbidity 2    Comorbidities  [id number] headaches, fibromyalgia,    Examination-Activity Limitations  Locomotion Level;Squat;Stairs;Stand;Lift    Examination-Participation Restrictions  Meal Prep;Community Activity;Laundry;Shop    Stability/Clinical Decision Making  Evolving/Moderate complexity    Clinical Decision Making  Moderate    Rehab Potential  Good    PT Frequency  2x / week   1-2   PT Duration  12 weeks    PT Treatment/Interventions  Aquatic Therapy;Electrical Stimulation;Moist Heat;Traction;Ultrasound;Therapeutic activities;Therapeutic exercise;Neuromuscular re-education;Manual techniques;Passive range of motion;Dry needling;Spinal Manipulations;Joint Manipulations;Taping    PT Next Visit Plan  Review HEP, needs stretching, core, LE strength, pain education    PT Home Exercise Plan  Access Code: [email or web address]       Patient will benefit from skilled therapeutic intervention in  order to improve the following deficits and impairments:  Decreased activity tolerance, Decreased endurance, Decreased range of motion, Decreased strength, Difficulty walking, Postural dysfunction, Pain, Increased muscle spasms  Visit Diagnosis: 1. Chronic bilateral low back pain with bilateral sciatica        Problem List Patient Active Problem List   Diagnosis Date Noted  . MDD (major depressive disorder), recurrent episode, moderate ([place]) 04/17/2017    Debbe Odea, PT,DPT 11/03/2018, 9:09 PM  [facility] [address] [place], [place], [address] Phone: [phone]   Fax:  [phone]  Name: Tykisha Areola MRN: [id number] Date of Birth: 01/31/1971

## 2018-11-17 ENCOUNTER — Ambulatory Visit: Payer: Medicaid Other | Admitting: Physical Therapy

## 2018-11-23 ENCOUNTER — Ambulatory Visit: Payer: Medicaid Other | Admitting: Physical Therapy

## 2018-11-25 ENCOUNTER — Ambulatory Visit: Payer: Medicaid Other | Attending: Nurse Practitioner | Admitting: Physical Therapy

## 2018-11-25 ENCOUNTER — Telehealth: Payer: Self-pay | Admitting: Physical Therapy

## 2018-11-25 ENCOUNTER — Encounter: Payer: Self-pay | Admitting: Physical Therapy

## 2018-11-25 DIAGNOSIS — G8929 Other chronic pain: Secondary | ICD-10-CM | POA: Insufficient documentation

## 2018-11-25 DIAGNOSIS — M5441 Lumbago with sciatica, right side: Secondary | ICD-10-CM | POA: Insufficient documentation

## 2018-11-25 DIAGNOSIS — M5442 Lumbago with sciatica, left side: Secondary | ICD-10-CM | POA: Insufficient documentation

## 2018-11-25 NOTE — Telephone Encounter (Signed)
Called patient regarding her missed appt this afternoon. She stated she forgot, she had a "procedure" on her arms and was still recovering.  She was made aware of her next appt 8/26 and she plans to attend.   Raeford Razor, PT 11/25/18 2:25 PM Phone: [phone] Fax: [phone]

## 2018-11-30 ENCOUNTER — Encounter: Payer: Medicaid Other | Admitting: Physical Therapy

## 2018-12-01 ENCOUNTER — Ambulatory Visit: Payer: Medicaid Other | Admitting: Physical Therapy

## 2018-12-01 ENCOUNTER — Other Ambulatory Visit: Payer: Self-pay

## 2018-12-01 ENCOUNTER — Encounter: Payer: Self-pay | Admitting: Physical Therapy

## 2018-12-01 DIAGNOSIS — M5442 Lumbago with sciatica, left side: Secondary | ICD-10-CM | POA: Diagnosis present

## 2018-12-01 DIAGNOSIS — G8929 Other chronic pain: Secondary | ICD-10-CM | POA: Diagnosis present

## 2018-12-01 DIAGNOSIS — M5441 Lumbago with sciatica, right side: Secondary | ICD-10-CM | POA: Diagnosis present

## 2018-12-01 NOTE — Therapy (Signed)
[place], [place], [address] Phone: [phone]   Fax:  [phone]  Physical Therapy Treatment  Patient Details  Name: Chelsea Cherry MRN: [id number] Date of Birth: Dec 15, 1970 Referring Provider (PT): Simona Huh, NP   Encounter Date: 12/01/2018  PT End of Session - 12/01/18 1314    Visit Number  2    Number of Visits  12    Date for PT Re-Evaluation  01/26/19    Authorization Type  MCD    PT Start Time  1307    PT Stop Time  1350    PT Time Calculation (min)  43 min    Activity Tolerance  Patient tolerated treatment well    Behavior During Therapy  [facility] for tasks assessed/performed       Past Medical History:  Diagnosis Date  . Anxiety   . Chronic headaches   . Colon polyps   . Depression   . Fibromyalgia   . Hyperlipidemia   . IBS (irritable bowel syndrome)   . Mitral valve prolapse   . SVT (supraventricular tachycardia) ([place])   . UTI (urinary tract infection)   . Vitamin D deficiency     Past Surgical History:  Procedure Laterality Date  . abdominal laproscopic surgery    . BREAST BIOPSY Right   . BREAST EXCISIONAL BIOPSY Right    x3  . BREAST EXCISIONAL BIOPSY Left   . FOOT SURGERY    . TUBAL LIGATION    . WISDOM TOOTH EXTRACTION      There were no vitals filed for this visit.  Subjective Assessment - 12/01/18 1312    Subjective  Pt states "L hip popping out". She has been trying to do exercises. She has most pain across low back.    Currently in Pain?  Yes    Pain Score  6     Pain Location  Back    Pain Orientation  Right;Left;Lower    Pain Descriptors / Indicators  Tightness    Pain Type  Chronic pain    Pain Onset  More than a month ago    Pain Frequency  Constant                       [facility] Adult PT Treatment/Exercise - 12/01/18 0001      Exercises   Exercises  Lumbar      Lumbar Exercises: Stretches   Active Hamstring Stretch  3 reps;30  seconds    Pelvic Tilt  --    Piriformis Stretch  30 seconds;2 reps;Right;Left      Lumbar Exercises: Aerobic   Nustep  add next visit      Lumbar Exercises: Standing   Row  20 reps    Theraband Level (Row)  Level 2 (Red)    Row Limitations  with practice for TA and neutral spine in standing.       Lumbar Exercises: Supine   Ab Set  10 reps    Pelvic Tilt  20 reps    Clam  20 reps    Clam Limitations  with TA and RTB    Bent Knee Raise  20 reps    Bent Knee Raise Limitations  with TA    Bridge  20 reps      Lumbar Exercises: Sidelying   Hip Abduction  15 reps;Both               PT Short Term Goals -  11/03/18 2104      PT SHORT TERM GOAL #1   Title  Pt will be I and compliant with HEP 4 weeks 12/03/18    Baseline  no HEP until today    Status  New      PT SHORT TERM GOAL #2   Title  Pt will report 2 ways of managing her pain at home other than with pain meds    Baseline  does not know how to manage other than with meds    Status  New        PT Long Term Goals - 11/03/18 2105      PT LONG TERM GOAL #1   Title  Pt will reduce pain overall 50% (target for all LTG 12 weeks 01/26/19)    Baseline  9/10    Status  New      PT LONG TERM GOAL #2   Title  Pt will improve ROM to [facility] to improve mobility.    Baseline  50% flexion, ext and sidebending for lumbar    Status  New      PT LONG TERM GOAL #3   Title  Pt will improve strength to at least 5-/5 MMT to improve function    Baseline  4+/5 MMT    Status  New      PT LONG TERM GOAL #4   Title  Pt will improve overall activity tolerance with standing and walking to at least 30 minutes for ADL's    Baseline  5 min    Status  New            Plan - 12/01/18 1356    Clinical Impression Statement  Reviewed HEP for mechanics, updated with TA and hip strength. Pt with good ability for ther ex today, does have difficulty with active TA contraction, and will benefit from further education on this. Pt with  weakness in core, will benefit from progressive strengthening for core and hips.    Personal Factors and Comorbidities  Fitness;Comorbidity 1;Comorbidity 2    Comorbidities  [id number] headaches, fibromyalgia,    Examination-Activity Limitations  Locomotion Level;Squat;Stairs;Stand;Lift    Examination-Participation Restrictions  Meal Prep;Community Activity;Laundry;Shop    Stability/Clinical Decision Making  Evolving/Moderate complexity    Rehab Potential  Good    PT Frequency  2x / week   1-2   PT Duration  12 weeks    PT Treatment/Interventions  Aquatic Therapy;Electrical Stimulation;Moist Heat;Traction;Ultrasound;Therapeutic activities;Therapeutic exercise;Neuromuscular re-education;Manual techniques;Passive range of motion;Dry needling;Spinal Manipulations;Joint Manipulations;Taping    PT Next Visit Plan  Review HEP, needs stretching, core, LE strength, pain education    PT Home Exercise Plan  Access Code: [email or web address]       Patient will benefit from skilled therapeutic intervention in order to improve the following deficits and impairments:  Decreased activity tolerance, Decreased endurance, Decreased range of motion, Decreased strength, Difficulty walking, Postural dysfunction, Pain, Increased muscle spasms  Visit Diagnosis: Chronic bilateral low back pain with bilateral sciatica     Problem List Patient Active Problem List   Diagnosis Date Noted  . MDD (major depressive disorder), recurrent episode, moderate ([place]) 04/17/2017    Lyndee Hensen, PT, DPT 1:58 PM  12/01/18    [facility] [address] [place], [place], [address] Phone: [phone]   Fax:  [phone]  Name: Chelsea Cherry MRN: [id number] Date of Birth: 1970-12-11

## 2018-12-03 ENCOUNTER — Encounter: Payer: Medicaid Other | Admitting: Physical Therapy

## 2018-12-06 ENCOUNTER — Ambulatory Visit
Admission: RE | Admit: 2018-12-06 | Discharge: 2018-12-06 | Disposition: A | Discharge status: Home / Self Care | Payer: Medicaid Other | Source: Ambulatory Visit | Attending: Nurse Practitioner | Admitting: Nurse Practitioner

## 2018-12-06 ENCOUNTER — Other Ambulatory Visit: Payer: Self-pay

## 2018-12-06 DIAGNOSIS — Z1231 Encounter for screening mammogram for malignant neoplasm of breast: Secondary | ICD-10-CM

## 2018-12-08 ENCOUNTER — Encounter: Payer: Self-pay | Admitting: Physical Therapy

## 2018-12-08 ENCOUNTER — Other Ambulatory Visit: Payer: Self-pay

## 2018-12-08 ENCOUNTER — Ambulatory Visit: Payer: Medicaid Other | Attending: Nurse Practitioner | Admitting: Physical Therapy

## 2018-12-08 DIAGNOSIS — G8929 Other chronic pain: Secondary | ICD-10-CM | POA: Diagnosis present

## 2018-12-08 DIAGNOSIS — M5441 Lumbago with sciatica, right side: Secondary | ICD-10-CM | POA: Diagnosis present

## 2018-12-08 DIAGNOSIS — M6281 Muscle weakness (generalized): Secondary | ICD-10-CM | POA: Insufficient documentation

## 2018-12-08 DIAGNOSIS — M5442 Lumbago with sciatica, left side: Secondary | ICD-10-CM | POA: Insufficient documentation

## 2018-12-08 NOTE — Therapy (Signed)
[place], [place], [address] Phone: [phone]   Fax:  [phone]  Physical Therapy Re-Evaluation  Patient Details  Name: Chelsea Cherry MRN: [id number] Date of Birth: 02/24/1971 Referring Provider (PT): Simona Huh, NP   Encounter Date: 12/08/2018  PT End of Session - 12/08/18 1017    Visit Number  3    Number of Visits  15    Date for PT Re-Evaluation  02/07/19    Authorization Type  MCD, re-evaluation for 12 more visits on9/05/2018    PT Start Time  1007    PT Stop Time  1045    PT Time Calculation (min)  38 min    Activity Tolerance  Patient tolerated treatment well    Behavior During Therapy  [facility] for tasks assessed/performed       Past Medical History:  Diagnosis Date  . Anxiety   . Chronic headaches   . Colon polyps   . Depression   . Fibromyalgia   . Hyperlipidemia   . IBS (irritable bowel syndrome)   . Mitral valve prolapse   . SVT (supraventricular tachycardia) ([place])   . UTI (urinary tract infection)   . Vitamin D deficiency     Past Surgical History:  Procedure Laterality Date  . abdominal laproscopic surgery    . BREAST BIOPSY Right   . BREAST EXCISIONAL BIOPSY Right    x3  . BREAST EXCISIONAL BIOPSY Left   . FOOT SURGERY    . TUBAL LIGATION    . WISDOM TOOTH EXTRACTION      There were no vitals filed for this visit.   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 Pt reporting pain bilateral hips.  Pt rated at 7-8/10.    Pertinent History  GBT:[id number] headaches, fibromyalgia,    Limitations  Sitting;Walking;Lifting;Standing    How long can you sit comfortably?  depends    How long can you stand comfortably?  5-10  min    How long can you walk comfortably?  5 min    Diagnostic tests  Lumbar MRI 05/2018 "Trace anterolisthesis of L4 on L5 with associated advanced  facetarthropathy, with resultant mild bilateral lateral recess stenosis. Facet disease at this level could contribute to lower back pain. Additional DDD and facet hypertrophy at L3-4 and L5-S1 without stenosis.    Currently in Pain?  Yes    Pain Score  8     Pain Location  Back    Pain Orientation  Right;Left    Pain Descriptors / Indicators  Aching;Sharp    Pain Type  Chronic pain    Pain Radiating Towards  radiation down bilateral LE's,    Pain Onset  More than a month ago         [facility] PT Assessment - 12/08/18 0001      Assessment   Medical Diagnosis  Lumbar pain with radiculopathy    Referring Provider (PT)  Simona Huh, NP    Next MD Visit  pt goes every 4weeks to see her PCP    Prior Therapy  not for her back      Precautions   Precautions  None      Restrictions   Weight Bearing Restrictions  No      Balance Screen   Has the patient fallen in the past 6 months  Yes  How many times?  1    Is the patient reluctant to leave their home because of a fear of falling?   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  Pt is a Social worker for clients    Leisure  going out for dinner and watching movies      Cognition   Overall Cognitive Status  Within Functional Limits for tasks assessed      Observation/Other Assessments   Focus on Therapeutic Outcomes (FOTO)   not done MCD      Coordination   Gross Motor Movements are Fluid and Coordinated  Yes      Posture/Postural Control   Posture/Postural Control  Postural limitations    Postural Limitations  Rounded Shoulders;Forward head    Posture Comments  slumped posture      AROM   AROM Assessment Site  Lumbar    Lumbar Flexion  75%    Lumbar Extension  50%   pain noted   Lumbar  - Right Side Bend  50%   pain noted   Lumbar - Left Side Bend  50%   pain noted   Lumbar - Right Rotation  75%   mild pain noted   Lumbar - Left Rotation  75%   increased pain on R flank     Strength   Overall Strength Comments  grossly 4/5 in R LE and 4+/5 in L LE      Flexibility   Soft Tissue Assessment /Muscle Length  yes    Hamstrings  R: 65 degrees, L: 62 degrees      Palpation   Palpation comment  TTP from T10- iliac crest, Pt also with tenderness noted into piriformis      Transfers   Transfers  Independent with all Transfers      Ambulation/Gait   Gait Comments  WFL                Objective measurements completed on examination: See above findings.              PT Education - 12/08/18 1015    Education Details  HEP, postural correction, reviewed PT POC    Person(s) Educated  Patient    Methods  Explanation    Comprehension  Verbalized understanding       PT Short Term Goals - 12/08/18 1019      PT SHORT TERM GOAL #1   Title  Pt will be I and compliant with HEP 4 weeks 12/03/18    Baseline  pt reporting she has been doing all her stretches issued at her last visit    Status  Achieved      PT Ririe #2   Title  Pt will report 2 ways of managing her pain at home other than with pain meds    Baseline  does not know how to manage other than with meds    Time  4    Period  Weeks    Status  On-going        PT Long Term Goals - 12/08/18 1021      PT LONG TERM GOAL #1  Title  Pt will reduce pain overall 50% (target for all LTG 12 weeks 02/07/2019)    Baseline  8/10 today    Time  8    Period  Weeks    Status  New      PT LONG TERM GOAL #2   Title  Pt will improve ROM to [facility] to improve mobility.    Time  8    Period  Weeks    Status  New      PT LONG TERM GOAL #3   Title  Pt will improve strength to at least 5-/5 MMT to improve function    Baseline  4+/5 MMT    Time  8    Period  Weeks    Status  New      PT LONG  TERM GOAL #4   Title  Pt will improve overall activity tolerance with standing and walking to at least 30 minutes for ADL's    Baseline  Pt reporting that she is able to stand/walk intermittenlty for periods of 20-30 minutes, but it is very inconsitent and usually reports pain.    Time  8    Period  Weeks    Status  On-going             Plan - 12/08/18 1050    Clinical Impression Statement  Pt arriving to therpay today for re-evaluation of her LBP which radiates down bilateral LE's. Pt presenting with limited trunk ROM and weakness in bilateral LE's. Pt's initial HEP was reviewed along with basic postural correction. Pt has met 1 of her STG's estabilihed at initial PT evaluation, still progressing towrad other STG and LTG's set. Pt has only been able to attend her initial evaluaiton and one PT treatment session.  I am requesting 12 additional visits to progress pt toward her PLOF and improved functional mobility.    Personal Factors and Comorbidities  Fitness;Comorbidity 1;Comorbidity 2    Comorbidities  TIW:[id number] headaches, fibromyalgia,    Examination-Activity Limitations  Locomotion Level;Squat;Stairs;Stand;Lift    Examination-Participation Restrictions  Meal Prep;Community Activity;Laundry;Shop    Stability/Clinical Decision Making  Evolving/Moderate complexity    Clinical Decision Making  Moderate    Rehab Potential  Good    PT Frequency  2x / week    PT Duration  6 weeks    PT Treatment/Interventions  Aquatic Therapy;Electrical Stimulation;Moist Heat;Traction;Ultrasound;Therapeutic activities;Therapeutic exercise;Neuromuscular re-education;Manual techniques;Passive range of motion;Dry needling;Spinal Manipulations;Joint Manipulations;Taping    PT Next Visit Plan  Review HEP, needs stretching, core, LE strength, pain education    PT Home Exercise Plan  Access Code: [email or web address]    Consulted and Agree with Plan of Care  Patient       Patient will benefit from skilled therapeutic  intervention in order to improve the following deficits and impairments:  Decreased activity tolerance, Decreased endurance, Decreased range of motion, Decreased strength, Difficulty walking, Postural dysfunction, Pain, Increased muscle spasms  Visit Diagnosis: Muscle weakness (generalized)  Chronic bilateral low back pain with bilateral sciatica     Problem List Patient Active Problem List   Diagnosis Date Noted  . MDD (major depressive disorder), recurrent episode, moderate ([place]) 04/17/2017    Oretha Caprice, PT 12/08/2018, 10:55 AM  [facility] [address] [place], [place], [address] Phone: [phone]   Fax:  [phone]  Name: Giulietta Prokop MRN: [id number] Date of Birth: [birth year]

## 2018-12-10 ENCOUNTER — Ambulatory Visit: Payer: Medicaid Other | Admitting: Physical Therapy

## 2018-12-15 ENCOUNTER — Encounter: Payer: Self-pay | Admitting: Physical Therapy

## 2018-12-15 ENCOUNTER — Ambulatory Visit: Payer: Medicaid Other | Admitting: Physical Therapy

## 2018-12-15 ENCOUNTER — Other Ambulatory Visit: Payer: Self-pay

## 2018-12-15 DIAGNOSIS — M6281 Muscle weakness (generalized): Secondary | ICD-10-CM | POA: Diagnosis not present

## 2018-12-15 DIAGNOSIS — M5442 Lumbago with sciatica, left side: Secondary | ICD-10-CM

## 2018-12-15 DIAGNOSIS — G8929 Other chronic pain: Secondary | ICD-10-CM

## 2018-12-15 NOTE — Therapy (Signed)
[place], [place], [address] Phone: [phone]   Fax:  [phone]  Physical Therapy Treatment  Patient Details  Name: Chelsea Cherry MRN: [id number] Date of Birth: [birth year] Referring Provider (PT): Simona Huh, NP   Encounter Date: 12/15/2018  PT End of Session - 12/15/18 1146    Visit Number  4    Number of Visits  15    Date for PT Re-Evaluation  02/07/19    Authorization Type  MCD, re-evaluation for 12 more visits on9/05/2018    PT Start Time  1135    PT Stop Time  1215    PT Time Calculation (min)  40 min    Activity Tolerance  Patient tolerated treatment well    Behavior During Therapy  [facility] for tasks assessed/performed       Past Medical History:  Diagnosis Date  . Anxiety   . Chronic headaches   . Colon polyps   . Depression   . Fibromyalgia   . Hyperlipidemia   . IBS (irritable bowel syndrome)   . Mitral valve prolapse   . SVT (supraventricular tachycardia) ([place])   . UTI (urinary tract infection)   . Vitamin D deficiency     Past Surgical History:  Procedure Laterality Date  . abdominal laproscopic surgery    . BREAST BIOPSY Right   . BREAST EXCISIONAL BIOPSY Right    x3  . BREAST EXCISIONAL BIOPSY Left   . FOOT SURGERY    . TUBAL LIGATION    . WISDOM TOOTH EXTRACTION      There were no vitals filed for this visit.  Subjective Assessment - 12/15/18 1144    Subjective  Pt states L hip is "dislocating" often. She states she "knows that it is dislocating, and she has had this problem for a long time" States soreness with exercises, can not do 2x/day.    Currently in Pain?  Yes    Pain Score  8     Pain Location  Back    Pain Orientation  Right;Left    Pain Descriptors / Indicators  Aching;Sharp    Pain Type  Chronic pain    Pain Onset  More than a month ago    Pain Frequency  Intermittent                       [facility] Adult PT Treatment/Exercise -  12/15/18 1137      Ambulation/Gait   Gait Comments  WFL      Posture/Postural Control   Posture/Postural Control  --    Postural Limitations  --    Posture Comments  --      Lumbar Exercises: Stretches   Single Knee to Chest Stretch  3 reps;30 seconds    Single Knee to Chest Stretch Limitations  bil      Lumbar Exercises: Aerobic   Nustep  NuStep x 5 min, L 5       Lumbar Exercises: Standing   Row  20 reps    Theraband Level (Row)  Level 3 (Green)    Row Limitations  practice for TA and neutral spine    Other Standing Lumbar Exercises  Hip abd 2x10 bil; march x20;       Lumbar Exercises: Supine   Pelvic Tilt  20 reps    Clam  20 reps    Clam Limitations  with TA and GTB    Bridge  20 reps  Straight Leg Raise  20 reps    Straight Leg Raises Limitations  bil    Other Supine Lumbar Exercises  Modified crunch x20;       Lumbar Exercises: Sidelying   Hip Abduction  10 reps;Both               PT Short Term Goals - 12/08/18 1019      PT SHORT TERM GOAL #1   Title  Pt will be I and compliant with HEP 4 weeks 12/03/18    Baseline  pt reporting she has been doing all her stretches issued at her last visit    Status  Achieved      PT SHORT TERM GOAL #2   Title  Pt will report 2 ways of managing her pain at home other than with pain meds    Baseline  does not know how to manage other than with meds    Time  4    Period  Weeks    Status  On-going        PT Long Term Goals - 12/08/18 1021      PT LONG TERM GOAL #1   Title  Pt will reduce pain overall 50% (target for all LTG 12 weeks 02/07/2019)    Baseline  8/10 today    Time  8    Period  Weeks    Status  New      PT LONG TERM GOAL #2   Title  Pt will improve ROM to [facility] to improve mobility.    Time  8    Period  Weeks    Status  New      PT LONG TERM GOAL #3   Title  Pt will improve strength to at least 5-/5 MMT to improve function    Baseline  4+/5 MMT    Time  8    Period  Weeks    Status  New       PT LONG TERM GOAL #4   Title  Pt will improve overall activity tolerance with standing and walking to at least 30 minutes for ADL's    Baseline  Pt reporting that she is able to stand/walk intermittenlty for periods of 20-30 minutes, but it is very inconsitent and usually reports pain.    Time  8    Period  Weeks    Status  On-going            Plan - 12/15/18 1657    Clinical Impression Statement  Pt states quite a bit of pain at start of session, but is able to do very well with ther ex for strength and stabilization today, does not report pain during exercise. Discussed possible snapping hip on L vs dislocation, and discussed ways to make L hip more comfortable during ther ex. Pt requires cuing for TA contraction during ther ex. Pt to benefit from progression of strength and stabilzation, with addition of more standing ther ex as able.    Personal Factors and Comorbidities  Fitness;Comorbidity 1;Comorbidity 2    Comorbidities  [id number] headaches, fibromyalgia,    Examination-Activity Limitations  Locomotion Level;Squat;Stairs;Stand;Lift    Examination-Participation Restrictions  Meal Prep;Community Activity;Laundry;Shop    Stability/Clinical Decision Making  Evolving/Moderate complexity    Rehab Potential  Good    PT Frequency  2x / week    PT Duration  6 weeks    PT Treatment/Interventions  Aquatic Therapy;Electrical Stimulation;Moist Heat;Traction;Ultrasound;Therapeutic activities;Therapeutic exercise;Neuromuscular re-education;Manual techniques;Passive range of motion;Dry needling;Spinal  Manipulations;Joint Manipulations;Taping    PT Next Visit Plan  Review HEP, needs stretching, core, LE strength, pain education    PT Home Exercise Plan  Access Code: [email or web address]    Consulted and Agree with Plan of Care  Patient       Patient will benefit from skilled therapeutic intervention in order to improve the following deficits and impairments:  Decreased activity tolerance, Decreased  endurance, Decreased range of motion, Decreased strength, Difficulty walking, Postural dysfunction, Pain, Increased muscle spasms  Visit Diagnosis: Chronic bilateral low back pain with bilateral sciatica  Muscle weakness (generalized)     Problem List Patient Active Problem List   Diagnosis Date Noted  . MDD (major depressive disorder), recurrent episode, moderate ([place]) 04/17/2017    Lyndee Hensen, PT, DPT 5:00 PM  12/15/18    [facility] [address] [place], [place], [address] Phone: [phone]   Fax:  [phone]  Name: Zahara Meeder MRN: [id number] Date of Birth: 12-Dec-1970

## 2018-12-16 ENCOUNTER — Other Ambulatory Visit: Payer: Self-pay | Admitting: Nurse Practitioner

## 2018-12-24 ENCOUNTER — Other Ambulatory Visit: Payer: Self-pay

## 2018-12-24 ENCOUNTER — Encounter: Payer: Self-pay | Admitting: Physical Therapy

## 2018-12-24 ENCOUNTER — Ambulatory Visit: Payer: Medicaid Other | Admitting: Physical Therapy

## 2018-12-24 DIAGNOSIS — G8929 Other chronic pain: Secondary | ICD-10-CM

## 2018-12-24 DIAGNOSIS — M6281 Muscle weakness (generalized): Secondary | ICD-10-CM | POA: Diagnosis not present

## 2018-12-24 NOTE — Therapy (Signed)
[place], [place], [address] Phone: [phone]   Fax:  [phone]  Physical Therapy Treatment  Patient Details  Name: Chelsea Cherry MRN: [id number] Date of Birth: [birth year] Referring Provider (PT): Simona Huh, NP   Encounter Date: 12/24/2018  PT End of Session - 12/24/18 1115    Visit Number  5    Number of Visits  15    Date for PT Re-Evaluation  02/07/19    Authorization Type  MCD, re-evaluation for 12 more visits on9/05/2018    PT Start Time  1108    PT Stop Time  1155    PT Time Calculation (min)  47 min    Activity Tolerance  Patient tolerated treatment well    Behavior During Therapy  [facility] for tasks assessed/performed       Past Medical History:  Diagnosis Date  . Anxiety   . Chronic headaches   . Colon polyps   . Depression   . Fibromyalgia   . Hyperlipidemia   . IBS (irritable bowel syndrome)   . Mitral valve prolapse   . SVT (supraventricular tachycardia) ([place])   . UTI (urinary tract infection)   . Vitamin D deficiency     Past Surgical History:  Procedure Laterality Date  . abdominal laproscopic surgery    . BREAST BIOPSY Right   . BREAST EXCISIONAL BIOPSY Right    x3  . BREAST EXCISIONAL BIOPSY Left   . FOOT SURGERY    . TUBAL LIGATION    . WISDOM TOOTH EXTRACTION      There were no vitals filed for this visit.  Subjective Assessment - 12/24/18 1110    Subjective  Pt arriving to therpay reporting she has been helping keeping her granddaughter who is 3 over the last 5 days. Pt reporting pain of 8/10 in her low back.    Pertinent History  [id number] headaches, fibromyalgia,    Limitations  Sitting;Walking;Lifting;Standing    How long can you sit comfortably?  depends    How long can you stand comfortably?  5-10  min    How long can you walk comfortably?  5 min    Diagnostic tests  Lumbar MRI 05/2018 "Trace anterolisthesis of L4 on L5 with associated advanced  facetarthropathy, with resultant mild bilateral lateral recess stenosis. Facet disease at this level could contribute to lower back pain. Additional DDD and facet hypertrophy at L3-4 and L5-S1 without stenosis.    Patient Stated Goals  get the pain more managed    Currently in Pain?  Yes    Pain Score  8     Pain Location  Back    Pain Orientation  Right;Left;Lower    Pain Descriptors / Indicators  Aching    Pain Type  Chronic pain    Pain Onset  More than a month ago    Pain Frequency  Intermittent    Pain Score  8    Pain Location  Hip    Pain Orientation  Left    Pain Descriptors / Indicators  Aching;Sharp    Pain Type  Chronic pain    Pain Onset  More than a month ago    Pain Frequency  Intermittent    Effect of Pain on Daily Activities  history of hip dysplasia in her family                       [facility] Adult PT Treatment/Exercise - 12/24/18 0001  Exercises   Exercises  Lumbar      Lumbar Exercises: Stretches   Single Knee to Chest Stretch  3 reps;30 seconds    Single Knee to Chest Stretch Limitations  bil      Lumbar Exercises: Aerobic   Nustep  L3 x 10 minutes, pt reporting it was helping her pain      Lumbar Exercises: Standing   Row  20 reps    Theraband Level (Row)  Level 3 (Green)    Row Limitations  activating TA and core stabilizers    Other Standing Lumbar Exercises  Hip abd 2x10 bil; march x20;       Lumbar Exercises: Supine   Pelvic Tilt  15 reps    Clam  20 reps    Clam Limitations  with TA and GTB    Bridge  10 reps    Bridge Limitations  increased pain with bridges    Straight Leg Raise  10 reps;2 seconds;Limitations    Straight Leg Raises Limitations  bilateral, more difficult on left side with incresaed pain    Other Supine Lumbar Exercises  Modified crunch x20;     Other Supine Lumbar Exercises  supine marching holding balance ring in bilateral UE's      Modalities   Modalities  Moist Heat      Moist Heat Therapy   Number  Minutes Moist Heat  10 Minutes    Moist Heat Location  Lumbar Spine             PT Education - 12/24/18 1113    Education Details  Reviewed HEP    Person(s) Educated  Patient    Methods  Explanation    Comprehension  Verbalized understanding       PT Short Term Goals - 12/24/18 1118      PT SHORT TERM GOAL #1   Title  Pt will be I and compliant with HEP 4 weeks 12/03/18    Baseline  pt reporting she has been doing all her stretches issued at her last visit    Time  4    Period  Weeks    Status  Achieved      PT SHORT TERM GOAL #2   Title  Pt will report 2 ways of managing her pain at home other than with pain meds    Baseline  does not know how to manage other than with meds    Time  4    Period  Weeks    Status  On-going        PT Long Term Goals - 12/24/18 1119      PT LONG TERM GOAL #1   Title  Pt will reduce pain overall 50% (target for all LTG 12 weeks 02/07/2019)    Baseline  8/10 today    Time  8    Period  Weeks    Status  New      PT LONG TERM GOAL #2   Title  Pt will improve ROM to [facility] to improve mobility.    Baseline  50% flexion, ext and sidebending for lumbar    Time  8    Period  Weeks    Status  New      PT LONG TERM GOAL #3   Title  Pt will improve strength to at least 5-/5 MMT to improve function    Baseline  4+/5 MMT    Time  8    Period  Weeks  PT LONG TERM GOAL #4   Title  Pt will improve overall activity tolerance with standing and walking to at least 30 minutes for ADL's    Baseline  Pt reporting that she is able to stand/walk intermittenlty for periods of 20-30 minutes, but it is very inconsitent and usually reports pain.    Period  Weeks    Status  On-going            Plan - 12/24/18 1116    Clinical Impression Statement  Pt arriving to therapy reporting 8/10 pain in her low back and L hip. Pt tolerating exercises well allowing rest breaks as pt needs. Pt reporting less pain at end of session of 6/10. Encouraged HEP  as tolerated. Pt reported she will be following up with her PCP about her ongoing left hip pain. Continue skilled PT.    Personal Factors and Comorbidities  Fitness;Comorbidity 1;Comorbidity 2    Comorbidities  [id number] headaches, fibromyalgia,    Examination-Activity Limitations  Locomotion Level;Squat;Stairs;Stand;Lift    Examination-Participation Restrictions  Meal Prep;Community Activity;Laundry;Shop    Stability/Clinical Decision Making  Evolving/Moderate complexity    Rehab Potential  Good    PT Frequency  2x / week    PT Duration  6 weeks    PT Treatment/Interventions  Aquatic Therapy;Electrical Stimulation;Moist Heat;Traction;Ultrasound;Therapeutic activities;Therapeutic exercise;Neuromuscular re-education;Manual techniques;Passive range of motion;Dry needling;Spinal Manipulations;Joint Manipulations;Taping    PT Next Visit Plan  Review HEP, needs stretching, core, LE strength, pain education    PT Home Exercise Plan  Access Code: [email or web address]    Consulted and Agree with Plan of Care  Patient       Patient will benefit from skilled therapeutic intervention in order to improve the following deficits and impairments:  Decreased activity tolerance, Decreased endurance, Decreased range of motion, Decreased strength, Difficulty walking, Postural dysfunction, Pain, Increased muscle spasms  Visit Diagnosis: Chronic bilateral low back pain with bilateral sciatica  Muscle weakness (generalized)     Problem List Patient Active Problem List   Diagnosis Date Noted  . MDD (major depressive disorder), recurrent episode, moderate ([place]) 04/17/2017    Oretha Caprice, PT 12/24/2018, 11:35 AM  [facility] [address] [place], [place], [address] Phone: [phone]   Fax:  [phone]  Name: Chelsea Cherry MRN: [id number] Date of Birth: 09/15/1970

## 2018-12-31 ENCOUNTER — Ambulatory Visit: Payer: Medicaid Other | Admitting: Physical Therapy

## 2019-01-07 ENCOUNTER — Other Ambulatory Visit: Payer: Self-pay

## 2019-01-07 ENCOUNTER — Encounter: Payer: Self-pay | Admitting: Physical Therapy

## 2019-01-07 ENCOUNTER — Ambulatory Visit: Payer: Medicaid Other | Attending: Nurse Practitioner | Admitting: Physical Therapy

## 2019-01-07 DIAGNOSIS — G8929 Other chronic pain: Secondary | ICD-10-CM | POA: Diagnosis present

## 2019-01-07 DIAGNOSIS — M5441 Lumbago with sciatica, right side: Secondary | ICD-10-CM | POA: Diagnosis present

## 2019-01-07 DIAGNOSIS — M6281 Muscle weakness (generalized): Secondary | ICD-10-CM | POA: Diagnosis present

## 2019-01-07 DIAGNOSIS — M5442 Lumbago with sciatica, left side: Secondary | ICD-10-CM | POA: Diagnosis present

## 2019-01-07 NOTE — Therapy (Signed)
[place], [place], [address] Phone: [phone]   Fax:  [phone]  Physical Therapy Treatment  Patient Details  Name: Chelsea Cherry MRN: [id number] Date of Birth: Aug 03, 1970 Referring Provider (PT): Simona Huh, NP   Encounter Date: 01/07/2019  PT End of Session - 01/07/19 1144    Visit Number  6    Number of Visits  15    Date for PT Re-Evaluation  02/07/19    Authorization Type  MCD, re-evaluation for 12 more visits on9/05/2018    PT Start Time  1135    PT Stop Time  1230    PT Time Calculation (min)  55 min    Activity Tolerance  Patient tolerated treatment well    Behavior During Therapy  [facility] for tasks assessed/performed       Past Medical History:  Diagnosis Date  . Anxiety   . Chronic headaches   . Colon polyps   . Depression   . Fibromyalgia   . Hyperlipidemia   . IBS (irritable bowel syndrome)   . Mitral valve prolapse   . SVT (supraventricular tachycardia) ([place])   . UTI (urinary tract infection)   . Vitamin D deficiency     Past Surgical History:  Procedure Laterality Date  . abdominal laproscopic surgery    . BREAST BIOPSY Right   . BREAST EXCISIONAL BIOPSY Right    x3  . BREAST EXCISIONAL BIOPSY Left   . FOOT SURGERY    . TUBAL LIGATION    . WISDOM TOOTH EXTRACTION      There were no vitals filed for this visit.  Subjective Assessment - 01/07/19 1140    Subjective  Pt arriving to therapy reporting, "I feel stronger", but my back and left hip are still hurting. Pt rating pain of 7.5/10.    Pertinent History  [id number] headaches, fibromyalgia,    Limitations  Sitting;Walking;Lifting;Standing    How long can you sit comfortably?  depends    How long can you stand comfortably?  5-10  min    How long can you walk comfortably?  5 min    Diagnostic tests  Lumbar MRI 05/2018 "Trace anterolisthesis of L4 on L5 with associated advanced facetarthropathy, with resultant  mild bilateral lateral recess stenosis. Facet disease at this level could contribute to lower back pain. Additional DDD and facet hypertrophy at L3-4 and L5-S1 without stenosis.    Patient Stated Goals  get the pain more managed    Currently in Pain?  Yes    Pain Score  8     Pain Location  Back    Pain Orientation  Left;Right    Pain Descriptors / Indicators  Aching    Pain Type  Chronic pain    Pain Onset  More than a month ago    Multiple Pain Sites  Yes    Pain Score  8    Pain Location  Hip    Pain Orientation  Left    Pain Descriptors / Indicators  Aching    Pain Type  Chronic pain    Pain Onset  More than a month ago                       [facility] Adult PT Treatment/Exercise - 01/07/19 0001      Exercises   Exercises  Lumbar      Lumbar Exercises: Stretches   Active Hamstring Stretch  Right;Left;2 reps;30 seconds  Single Knee to Chest Stretch  3 reps;30 seconds    Single Knee to Chest Stretch Limitations  bil    Piriformis Stretch  Right;Left;3 reps;30 seconds    Piriformis Stretch Limitations  modified    Figure 4 Stretch  2 reps;30 seconds    Figure 4 Stretch Limitations  bil      Lumbar Exercises: Aerobic   Nustep  L3 x 6 minutes      Lumbar Exercises: Standing   Row  --    Theraband Level (Row)  --    Row Limitations  --      Lumbar Exercises: Seated   Other Seated Lumbar Exercises  rows with green theraband, instructions for TA activation      Lumbar Exercises: Supine   Pelvic Tilt  10 reps;10 seconds    Clam  20 reps    Clam Limitations  with TA and GTB    Bridge  10 reps    Bridge Limitations  increased pain with bridges    Straight Leg Raise  10 reps;2 seconds;Limitations    Straight Leg Raises Limitations  bilateral, more difficult on left side with incresaed pain    Other Supine Lumbar Exercises  Modified crunch x20;     Other Supine Lumbar Exercises  supine marching holding balance ring in bilateral UE's      Modalities    Modalities  Moist Heat      Moist Heat Therapy   Number Minutes Moist Heat  10 Minutes    Moist Heat Location  Lumbar Spine      Manual Therapy   Manual Therapy  Soft tissue mobilization    Manual therapy comments  10 minutes    Soft tissue mobilization  L IT band, gluteals, and Piriformis, IT band rolling               PT Short Term Goals - 01/07/19 1150      PT SHORT TERM GOAL #1   Title  Pt will be I and compliant with HEP 4 weeks 12/03/18    Baseline  pt reporting she has been doing all her stretches issued at her last visit    Time  4    Period  Weeks    Status  Achieved      PT SHORT TERM GOAL #2   Title  Pt will report 2 ways of managing her pain at home other than with pain meds    Baseline  does not know how to manage other than with meds    Time  4    Period  Weeks    Status  On-going        PT Long Term Goals - 12/24/18 1119      PT LONG TERM GOAL #1   Title  Pt will reduce pain overall 50% (target for all LTG 12 weeks 02/07/2019)    Baseline  8/10 today    Time  8    Period  Weeks    Status  New      PT LONG TERM GOAL #2   Title  Pt will improve ROM to [facility] to improve mobility.    Baseline  50% flexion, ext and sidebending for lumbar    Time  8    Period  Weeks    Status  New      PT LONG TERM GOAL #3   Title  Pt will improve strength to at least 5-/5 MMT to improve function  Baseline  4+/5 MMT    Time  8    Period  Weeks      PT LONG TERM GOAL #4   Title  Pt will improve overall activity tolerance with standing and walking to at least 30 minutes for ADL's    Baseline  Pt reporting that she is able to stand/walk intermittenlty for periods of 20-30 minutes, but it is very inconsitent and usually reports pain.    Period  Weeks    Status  On-going            Plan - 01/07/19 1145    Clinical Impression Statement  Pt arriving to therapy today reporting 7.5/10 pain. Pt stated she had a cool sculpting appointment last week and unable to  make it to her therapy appointment due to pain the following day. Pt tolerating all exericses well and reporting less pain at end of session of 5-6/10. Continue with skilled PT and progress pt toward goals set.    Personal Factors and Comorbidities  Fitness;Comorbidity 1;Comorbidity 2    Comorbidities  [id number] headaches, fibromyalgia,    Examination-Activity Limitations  Locomotion Level;Squat;Stairs;Stand;Lift    Examination-Participation Restrictions  Meal Prep;Community Activity;Laundry;Shop    Stability/Clinical Decision Making  Evolving/Moderate complexity    Rehab Potential  Good    PT Frequency  2x / week    PT Duration  6 weeks    PT Treatment/Interventions  Aquatic Therapy;Electrical Stimulation;Moist Heat;Traction;Ultrasound;Therapeutic activities;Therapeutic exercise;Neuromuscular re-education;Manual techniques;Passive range of motion;Dry needling;Spinal Manipulations;Joint Manipulations;Taping    PT Next Visit Plan  Review HEP, needs stretching, core, LE strength, pain education    PT Home Exercise Plan  Access Code: [email or web address], verbally added hip adduction with ball    Consulted and Agree with Plan of Care  Patient       Patient will benefit from skilled therapeutic intervention in order to improve the following deficits and impairments:  Decreased activity tolerance, Decreased endurance, Decreased range of motion, Decreased strength, Difficulty walking, Postural dysfunction, Pain, Increased muscle spasms  Visit Diagnosis: Chronic bilateral low back pain with bilateral sciatica  Muscle weakness (generalized)     Problem List Patient Active Problem List   Diagnosis Date Noted  . MDD (major depressive disorder), recurrent episode, moderate ([place]) 04/17/2017    Oretha Caprice, PT 01/07/2019, 12:24 PM  [facility] [address] [place], [place], [address] Phone: [phone]   Fax:  [phone]  Name: Chelsea Cherry MRN: [id number] Date of Birth: December 19, 1970

## 2019-01-14 ENCOUNTER — Encounter: Payer: Self-pay | Admitting: Physical Therapy

## 2019-01-14 ENCOUNTER — Other Ambulatory Visit: Payer: Self-pay

## 2019-01-14 ENCOUNTER — Ambulatory Visit: Payer: Medicaid Other | Admitting: Physical Therapy

## 2019-01-14 DIAGNOSIS — G8929 Other chronic pain: Secondary | ICD-10-CM

## 2019-01-14 DIAGNOSIS — M6281 Muscle weakness (generalized): Secondary | ICD-10-CM

## 2019-01-14 DIAGNOSIS — M5442 Lumbago with sciatica, left side: Secondary | ICD-10-CM

## 2019-01-14 NOTE — Therapy (Signed)
[place], [place], [address] Phone: [phone]   Fax:  [phone]  Physical Therapy Treatment  Patient Details  Name: Chelsea Cherry MRN: [id number] Date of Birth: May 08, 1970 Referring Provider (PT): Simona Huh, NP   Encounter Date: 01/14/2019  PT End of Session - 01/14/19 1235    Visit Number  7    Number of Visits  15    Date for PT Re-Evaluation  02/07/19    Authorization Type  MCD, re-evaluation for 12 more visits on9/05/2018    PT Start Time  1234    PT Stop Time  1313    PT Time Calculation (min)  39 min    Activity Tolerance  Patient tolerated treatment well    Behavior During Therapy  [facility] for tasks assessed/performed       Past Medical History:  Diagnosis Date  . Anxiety   . Chronic headaches   . Colon polyps   . Depression   . Fibromyalgia   . Hyperlipidemia   . IBS (irritable bowel syndrome)   . Mitral valve prolapse   . SVT (supraventricular tachycardia) ([place])   . UTI (urinary tract infection)   . Vitamin D deficiency     Past Surgical History:  Procedure Laterality Date  . abdominal laproscopic surgery    . BREAST BIOPSY Right   . BREAST EXCISIONAL BIOPSY Right    x3  . BREAST EXCISIONAL BIOPSY Left   . FOOT SURGERY    . TUBAL LIGATION    . WISDOM TOOTH EXTRACTION      There were no vitals filed for this visit.  Subjective Assessment - 01/14/19 1237    Subjective  Sharp pain on Left side. Still feel bruised from roller. Nothing is broken, it's just my body. My legs feel stronger, I can use pelvic tilts when I am hurting to decrease pain.    Patient Stated Goals  get the pain more managed    Currently in Pain?  Yes    Pain Score  8     Pain Location  Back    Pain Orientation  Left;Lower    Pain Descriptors / Indicators  Sharp;Stabbing    Aggravating Factors   standing, moving a certain way    Pain Relieving Factors  lay supine, pain meds                        [facility] Adult PT Treatment/Exercise - 01/14/19 0001      Lumbar Exercises: Stretches   Other Lumbar Stretch Exercise  physioball roll out flexion stretch      Lumbar Exercises: Aerobic   Nustep  5 min L4 UE & LE      Lumbar Exercises: Standing   Other Standing Lumbar Exercises  postural alignment- weight to heels      Lumbar Exercises: Seated   Other Seated Lumbar Exercises  resting posture- weight in "sit bones"    Other Seated Lumbar Exercises  UE isometric press on physioball- double arm & single               PT Short Term Goals - 01/07/19 1150      PT SHORT TERM GOAL #1   Title  Pt will be I and compliant with HEP 4 weeks 12/03/18    Baseline  pt reporting she has been doing all her stretches issued at her last visit    Time  4    Period  Weeks    Status  Achieved      PT SHORT TERM GOAL #2   Title  Pt will report 2 ways of managing her pain at home other than with pain meds    Baseline  does not know how to manage other than with meds    Time  4    Period  Weeks    Status  On-going        PT Long Term Goals - 12/24/18 1119      PT LONG TERM GOAL #1   Title  Pt will reduce pain overall 50% (target for all LTG 12 weeks 02/07/2019)    Baseline  8/10 today    Time  8    Period  Weeks    Status  New      PT LONG TERM GOAL #2   Title  Pt will improve ROM to [facility] to improve mobility.    Baseline  50% flexion, ext and sidebending for lumbar    Time  8    Period  Weeks    Status  New      PT LONG TERM GOAL #3   Title  Pt will improve strength to at least 5-/5 MMT to improve function    Baseline  4+/5 MMT    Time  8    Period  Weeks      PT LONG TERM GOAL #4   Title  Pt will improve overall activity tolerance with standing and walking to at least 30 minutes for ADL's    Baseline  Pt reporting that she is able to stand/walk intermittenlty for periods of 20-30 minutes, but it is very inconsitent and usually reports pain.     Period  Weeks    Status  On-going            Plan - 01/14/19 1315    Clinical Impression Statement  Exercises have been very helpful to improve strength but she is still having significant pain. Todays focus was on proper resting posture to utilize strength which she did well with.    PT Treatment/Interventions  Aquatic Therapy;Electrical Stimulation;Moist Heat;Traction;Ultrasound;Therapeutic activities;Therapeutic exercise;Neuromuscular re-education;Manual techniques;Passive range of motion;Dry needling;Spinal Manipulations;Joint Manipulations;Taping    PT Next Visit Plan  review posture, continue core strengthening, she is considering purchasing a physioball    PT Home Exercise Plan  Access Code: [email or web address], verbally added hip adduction with ball    Consulted and Agree with Plan of Care  Patient       Patient will benefit from skilled therapeutic intervention in order to improve the following deficits and impairments:  Decreased activity tolerance, Decreased endurance, Decreased range of motion, Decreased strength, Difficulty walking, Postural dysfunction, Pain, Increased muscle spasms  Visit Diagnosis: Chronic bilateral low back pain with bilateral sciatica  Muscle weakness (generalized)     Problem List Patient Active Problem List   Diagnosis Date Noted  . MDD (major depressive disorder), recurrent episode, moderate ([place]) 04/17/2017    Kani Jobson C. Lilburn Straw PT, DPT 01/14/19 1:17 PM   [facility] [address] [place], [place], [address] Phone: [phone]   Fax:  [phone]  Name: Chelsea Cherry MRN: [id number] Date of Birth: 1970/04/28

## 2019-01-19 ENCOUNTER — Other Ambulatory Visit: Payer: Self-pay | Admitting: Nurse Practitioner

## 2019-01-19 DIAGNOSIS — R922 Inconclusive mammogram: Secondary | ICD-10-CM

## 2019-01-21 ENCOUNTER — Ambulatory Visit: Payer: Medicaid Other | Admitting: Physical Therapy

## 2019-01-25 ENCOUNTER — Other Ambulatory Visit: Payer: Self-pay | Admitting: Nurse Practitioner

## 2019-01-25 DIAGNOSIS — Z1239 Encounter for other screening for malignant neoplasm of breast: Secondary | ICD-10-CM

## 2019-02-02 ENCOUNTER — Encounter: Payer: Self-pay | Admitting: Physical Therapy

## 2019-02-02 ENCOUNTER — Other Ambulatory Visit: Payer: Self-pay

## 2019-02-02 ENCOUNTER — Ambulatory Visit: Payer: Medicaid Other | Admitting: Physical Therapy

## 2019-02-02 DIAGNOSIS — M6281 Muscle weakness (generalized): Secondary | ICD-10-CM

## 2019-02-02 DIAGNOSIS — G8929 Other chronic pain: Secondary | ICD-10-CM

## 2019-02-02 DIAGNOSIS — M5442 Lumbago with sciatica, left side: Secondary | ICD-10-CM | POA: Diagnosis not present

## 2019-02-02 DIAGNOSIS — M5441 Lumbago with sciatica, right side: Secondary | ICD-10-CM

## 2019-02-02 NOTE — Therapy (Signed)
[place], [place], [address] Phone: [phone]   Fax:  [phone]  Physical Therapy Treatment Re-eval  Patient Details  Name: Chelsea Cherry MRN: [id number] Date of Birth: 1971/02/02 Referring Provider (PT): Simona Huh, NP   Encounter Date: 02/02/2019  PT End of Session - 02/02/19 1223    Visit Number  8    Number of Visits  20    Date for PT Re-Evaluation  03/30/19    Authorization Type  MCD re-evaluation for 12 more visits on 02/02/2019 (2x week for 6 weeks)    PT Start Time  1215    PT Stop Time  1305    PT Time Calculation (min)  50 min    Activity Tolerance  Patient tolerated treatment well    Behavior During Therapy  [facility] for tasks assessed/performed       Past Medical History:  Diagnosis Date  . Anxiety   . Chronic headaches   . Colon polyps   . Depression   . Fibromyalgia   . Hyperlipidemia   . IBS (irritable bowel syndrome)   . Mitral valve prolapse   . SVT (supraventricular tachycardia) ([place])   . UTI (urinary tract infection)   . Vitamin D deficiency     Past Surgical History:  Procedure Laterality Date  . abdominal laproscopic surgery    . BREAST BIOPSY Right   . BREAST EXCISIONAL BIOPSY Right    x3  . BREAST EXCISIONAL BIOPSY Left   . FOOT SURGERY    . TUBAL LIGATION    . WISDOM TOOTH EXTRACTION      There were no vitals filed for this visit.  Subjective Assessment - 02/02/19 1217    Subjective  Pt arriving to therapy reporting weight loss now at 35 pounds. Pt also still reporting left sided hip and LE pain. Pt still reporting numbness in bilateral LE's but it's worse on Left side. Pt reporting she has an appointment next week for CT scan of pelvis/hips.    Pertinent History  [id number] headaches, fibromyalgia,    Limitations  Sitting;Walking;Lifting;Standing    How long can you sit comfortably?  depends    How long can you stand comfortably?  5-10  min    How long can you walk comfortably?  5 min    Diagnostic tests  Lumbar MRI 05/2018 "Trace anterolisthesis of L4 on L5 with associated advanced facetarthropathy, with resultant mild bilateral lateral recess stenosis. Facet disease at this level could contribute to lower back pain. Additional DDD and facet hypertrophy at L3-4 and L5-S1 without stenosis.    Patient Stated Goals  get the pain more managed    Currently in Pain?  Yes    Pain Score  8     Pain Location  Back    Pain Orientation  Left;Lower    Pain Descriptors / Indicators  Stabbing;Sharp;Aching;Burning    Pain Type  Chronic pain    Pain Radiating Towards  radiation down bilateral LE's worse on the left side    Pain Onset  More than a month ago    Pain Frequency  Intermittent    Aggravating Factors   standing, sitting long periods of time.    Pain Relieving Factors  lying down in with head and LE's elevated, pain meds    Effect of Pain on Daily Activities  limits all ADL's.    Pain Score  6    Pain Location  Hip    Pain Orientation  Left    Pain Descriptors / Indicators  Aching;Sore    Pain Type  Chronic pain    Pain Onset  More than a month ago    Pain Frequency  Intermittent    Aggravating Factors   standing, sitting prolonged    Pain Relieving Factors  chaning postions         [facility] PT Assessment - 02/02/19 0001      Assessment   Medical Diagnosis  lumbar pain with radiculopathy    Referring Provider (PT)  Simona Huh, NP      Balance Screen   Has the patient fallen in the past 6 months  No    Is the patient reluctant to leave their home because of a fear of falling?   No      Cognition   Overall Cognitive Status  Within Functional Limits for tasks assessed      Posture/Postural Control   Posture Comments  Pt able to self correct with verbal cues      ROM / Strength   AROM / PROM / Strength  Strength      AROM   Lumbar Flexion  WNL with pain noted    Lumbar Extension  50%     Lumbar - Right Side Bend  WNL      Lumbar - Left Side Bend  WNL    Lumbar - Right Rotation  75% with pain    Lumbar - Left Rotation  75% with pain      Strength   Overall Strength Comments  Pt has improved her bilateral hip strength to 5/5   pain noted during MMT     Palpation   SI assessment   crepitus noted in lumbar spine and SI joint when performing anterior/posterior pelvic tilts.     Palpation comment  TTP lumbar spine, lumbar paraspinals, SI joint and L hip      Ambulation/Gait   Gait Comments  WFL                   [facility] Adult PT Treatment/Exercise - 02/02/19 0001      Lumbar Exercises: Stretches   Other Lumbar Stretch Exercise  physioball roll out flexion stretch      Lumbar Exercises: Aerobic   Nustep  6 min L4 UE & LE      Lumbar Exercises: Standing   Other Standing Lumbar Exercises  postural alignment- weight to heels      Lumbar Exercises: Seated   Other Seated Lumbar Exercises  sitting opposite arm to knee, cues to maintain posture, anterior posterior pelvic tilts (pt reporting incresaed pain)    Other Seated Lumbar Exercises  UE isometric press on physioball- double arm & single             PT Education - 02/02/19 1222    Education Details  reviewed HEP    Person(s) Educated  Patient    Methods  Explanation    Comprehension  Verbalized understanding       PT Short Term Goals - 02/02/19 1229      PT SHORT TERM GOAL #1   Title  Pt will be I and compliant with HEP 4 weeks 12/03/18    Baseline  pt reporting she has been doing all her stretches issued at her last visit    Period  Weeks    Status  Achieved      PT SHORT TERM GOAL #2   Title  Pt will report 2  ways of managing her pain at home other than with pain meds    Baseline  pt tries other ways, but ends up having to take pain meds to get any relief    Period  Weeks    Status  On-going        PT Long Term Goals - 02/02/19 1230      PT LONG TERM GOAL #1   Title  Pt will reduce pain overall 50% (target for  all LTG 12 weeks 02/07/2019)    Baseline  Pt still reporting 8/10 LBP and 6/10 left hip pain today upon arrival    Time  8    Period  Weeks    Status  On-going      PT LONG TERM GOAL #2   Title  Pt will improve ROM to [facility] to improve mobility.    Baseline  50% flexion, ext and rotation    Time  8    Period  Weeks    Status  On-going      PT LONG TERM GOAL #3   Title  Pt will improve strength to at least 5-/5 MMT to improve function    Baseline  5/5 MMT    Time  8    Period  Weeks    Status  Achieved      PT LONG TERM GOAL #4   Title  Pt will improve overall activity tolerance with standing and walking to at least 30 minutes for ADL's    Baseline  Pt reporting that she is able to stand/walk intermittenlty for periods of 20-30 minutes, but it is very inconsitent and usually reports pain.    Period  Weeks    Status  On-going            Plan - 02/02/19 1238    Clinical Impression Statement  Pt arriving to therapy reporting 8/10 pain in her low back, pt also with 6/10 pain in her left hip. Pt reporting that she took pain meds prior to therpay session. Pt also reporting she feels therapy has helped with strengthening and flexibility. Pt has met her LTG of improving bilateral LE srength to 5/5. Pt with crepitus in her lumbar spine when performing anterior/posterior pelvic tils whle sitting on the ball.  I am requesting 12 additional visits since pt is contining to progress toward other LTG's set. Pt has attended 8 visits out of her last 15 due to being out of town for a couple of weeks and unable to come to appointments. Pt is scheduled for CT scan next week. Requesting 12 additional visits for 2x/week for 6 weeks.    Personal Factors and Comorbidities  Fitness;Comorbidity 1;Comorbidity 2    Comorbidities  [id number] headaches, fibromyalgia,    Examination-Activity Limitations  Locomotion Level;Squat;Stairs;Stand;Lift    Examination-Participation Restrictions  Meal Prep;Community  Activity;Laundry;Shop    Stability/Clinical Decision Making  Evolving/Moderate complexity    Rehab Potential  Good    PT Frequency  2x / week    PT Duration  6 weeks    PT Treatment/Interventions  Aquatic Therapy;Electrical Stimulation;Moist Heat;Traction;Ultrasound;Therapeutic activities;Therapeutic exercise;Neuromuscular re-education;Manual techniques;Passive range of motion;Dry needling;Spinal Manipulations;Joint Manipulations;Taping    PT Next Visit Plan  review posture, continue core strengthening, she is considering purchasing a physioball    PT Home Exercise Plan  Access Code: [email or web address], verbally added hip adduction with ball    Consulted and Agree with Plan of Care  Patient       Patient will benefit from skilled  therapeutic intervention in order to improve the following deficits and impairments:  Decreased activity tolerance, Decreased endurance, Decreased range of motion, Decreased strength, Difficulty walking, Postural dysfunction, Pain, Increased muscle spasms  Visit Diagnosis: Chronic bilateral low back pain with bilateral sciatica  Muscle weakness (generalized)     Problem List Patient Active Problem List   Diagnosis Date Noted  . MDD (major depressive disorder), recurrent episode, moderate ([place]) 04/17/2017    Oretha Caprice, PT 02/02/2019, 1:19 PM  [facility] [address] [place], [place], [address] Phone: [phone]   Fax:  [phone]  Name: Chelsea Cherry MRN: [id number] Date of Birth: [birth year]

## 2019-02-09 ENCOUNTER — Other Ambulatory Visit: Payer: Self-pay

## 2019-02-09 ENCOUNTER — Ambulatory Visit: Payer: Medicaid Other | Attending: Nurse Practitioner | Admitting: Physical Therapy

## 2019-02-09 ENCOUNTER — Encounter: Payer: Self-pay | Admitting: Physical Therapy

## 2019-02-09 DIAGNOSIS — M5441 Lumbago with sciatica, right side: Secondary | ICD-10-CM | POA: Insufficient documentation

## 2019-02-09 DIAGNOSIS — M5442 Lumbago with sciatica, left side: Secondary | ICD-10-CM | POA: Diagnosis not present

## 2019-02-09 DIAGNOSIS — M6281 Muscle weakness (generalized): Secondary | ICD-10-CM | POA: Insufficient documentation

## 2019-02-09 DIAGNOSIS — G8929 Other chronic pain: Secondary | ICD-10-CM | POA: Diagnosis present

## 2019-02-09 NOTE — Therapy (Signed)
[place], [place], [address] Phone: [phone]   Fax:  [phone]  Physical Therapy Treatment  Patient Details  Name: Chelsea Cherry MRN: [id number] Date of Birth: [birth year] Referring Provider (PT): Simona Huh, NP   Encounter Date: 02/09/2019  PT End of Session - 02/09/19 1503    Visit Number  9    Date for PT Re-Evaluation  03/30/19    Authorization Type  MCD re-evaluation for 12 more visits on 02/02/2019 (2x week for 6 weeks)    Authorization Time Period  11/3-12/14    Authorization - Visit Number  1    Authorization - Number of Visits  12    PT Start Time  1500    PT Stop Time  1545    PT Time Calculation (min)  45 min    Activity Tolerance  Patient tolerated treatment well    Behavior During Therapy  [facility] for tasks assessed/performed       Past Medical History:  Diagnosis Date  . Anxiety   . Chronic headaches   . Colon polyps   . Depression   . Fibromyalgia   . Hyperlipidemia   . IBS (irritable bowel syndrome)   . Mitral valve prolapse   . SVT (supraventricular tachycardia) ([place])   . UTI (urinary tract infection)   . Vitamin D deficiency     Past Surgical History:  Procedure Laterality Date  . abdominal laproscopic surgery    . BREAST BIOPSY Right   . BREAST EXCISIONAL BIOPSY Right    x3  . BREAST EXCISIONAL BIOPSY Left   . FOOT SURGERY    . TUBAL LIGATION    . WISDOM TOOTH EXTRACTION      There were no vitals filed for this visit.  Subjective Assessment - 02/09/19 1503    Subjective  I have been in agony. I see my pain doctor on Friday. The Nu Step will feel good but everything else will be terrible. CT scan was rejected this morning.    Patient Stated Goals  get the pain more managed    Currently in Pain?  Yes    Pain Score  7     Pain Location  Back    Pain Orientation  Left;Lower    Pain Descriptors / Indicators  --   agony   Aggravating Factors   movement    Pain Relieving Factors  pain meds- have had to increase                       [facility] Adult PT Treatment/Exercise - 02/09/19 0001      Lumbar Exercises: Aerobic   Nustep  10 min L5 UE & LE      Lumbar Exercises: Seated   Other Seated Lumbar Exercises  bath tub exercises      Manual Therapy   Manual therapy comments  skilled palpation and monitoring during TPDN    Soft tissue mobilization  Lt gluts, Rt lower lumbar paraspinals       Trigger Point Dry Needling - 02/09/19 0001    Consent Given?  Yes    Education Handout Provided  --   verbal education   Muscles Treated Back/Hip  Gluteus minimus;Gluteus medius;Gluteus maximus    Dry Needling Comments  Rt lumbar paraspinals L3-L4    Gluteus Minimus Response  Twitch response elicited;Palpable increased muscle length    Gluteus Medius Response  Twitch response elicited;Palpable increased muscle length  Gluteus Maximus Response  Twitch response elicited;Palpable increased muscle length           PT Education - 02/09/19 1626    Education Details  TPDN & expected outcomes, soreness vs pain    Person(s) Educated  Patient    Methods  Explanation    Comprehension  Verbalized understanding;Need further instruction       PT Short Term Goals - 02/02/19 1229      PT SHORT TERM GOAL #1   Title  Pt will be I and compliant with HEP 4 weeks 12/03/18    Baseline  pt reporting she has been doing all her stretches issued at her last visit    Period  Weeks    Status  Achieved      PT SHORT TERM GOAL #2   Title  Pt will report 2 ways of managing her pain at home other than with pain meds    Baseline  pt tries other ways, but ends up having to take pain meds to get any relief    Period  Weeks    Status  On-going        PT Long Term Goals - 02/02/19 1230      PT LONG TERM GOAL #1   Title  Pt will reduce pain overall 50% (target for all LTG 12 weeks 02/07/2019)    Baseline  Pt still reporting 8/10 LBP and 6/10 left hip  pain today upon arrival    Time  8    Period  Weeks    Status  On-going      PT LONG TERM GOAL #2   Title  Pt will improve ROM to [facility] to improve mobility.    Baseline  50% flexion, ext and rotation    Time  8    Period  Weeks    Status  On-going      PT LONG TERM GOAL #3   Title  Pt will improve strength to at least 5-/5 MMT to improve function    Baseline  5/5 MMT    Time  8    Period  Weeks    Status  Achieved      PT LONG TERM GOAL #4   Title  Pt will improve overall activity tolerance with standing and walking to at least 30 minutes for ADL's    Baseline  Pt reporting that she is able to stand/walk intermittenlty for periods of 20-30 minutes, but it is very inconsitent and usually reports pain.    Period  Weeks    Status  On-going            Plan - 02/09/19 1623    Clinical Impression Statement  As pt has not made change with pain we decided to try TPDN today. Pt tolerated this very well and reported decreased popping in lower back when ambulating and decreased pain levels. Is interested in aquatic rehab but not covered by insurance- provided with exercises she can do in her tub at home to try. Discussed soreness vs pain as soreness is expected post DN, will evaluate outcome at next visit and continue PRN.    PT Treatment/Interventions  Aquatic Therapy;Electrical Stimulation;Moist Heat;Traction;Ultrasound;Therapeutic activities;Therapeutic exercise;Neuromuscular re-education;Manual techniques;Passive range of motion;Dry needling;Spinal Manipulations;Joint Manipulations;Taping    PT Next Visit Plan  outcome of DN? outcome of aquatic exercises    PT Home Exercise Plan  Access Code: [email or web address], verbally added hip adduction with ball, tub exercises.    Consulted and Agree with  Plan of Care  Patient       Patient will benefit from skilled therapeutic intervention in order to improve the following deficits and impairments:  Decreased activity tolerance, Decreased endurance,  Decreased range of motion, Decreased strength, Difficulty walking, Postural dysfunction, Pain, Increased muscle spasms  Visit Diagnosis: Chronic bilateral low back pain with bilateral sciatica  Muscle weakness (generalized)     Problem List Patient Active Problem List   Diagnosis Date Noted  . MDD (major depressive disorder), recurrent episode, moderate ([place]) 04/17/2017  Estephanie Hubbs C. Cordaro Mukai PT, DPT 02/09/19 4:26 PM   [facility] [address] [place], [place], [address] Phone: [phone]   Fax:  [phone]  Name: Chelsea Cherry MRN: [id number] Date of Birth: Aug 25, 1970

## 2019-02-10 ENCOUNTER — Other Ambulatory Visit: Payer: Medicaid Other

## 2019-02-16 ENCOUNTER — Encounter: Payer: Self-pay | Admitting: Physical Therapy

## 2019-02-16 ENCOUNTER — Ambulatory Visit: Payer: Medicaid Other | Admitting: Physical Therapy

## 2019-02-16 ENCOUNTER — Other Ambulatory Visit: Payer: Self-pay

## 2019-02-16 DIAGNOSIS — G8929 Other chronic pain: Secondary | ICD-10-CM

## 2019-02-16 DIAGNOSIS — M5442 Lumbago with sciatica, left side: Secondary | ICD-10-CM

## 2019-02-16 DIAGNOSIS — M6281 Muscle weakness (generalized): Secondary | ICD-10-CM

## 2019-02-16 NOTE — Therapy (Signed)
[place], [place], [address] Phone: [phone]   Fax:  [phone]  Physical Therapy Treatment  Patient Details  Name: Chelsea Cherry MRN: [id number] Date of Birth: 11-25-1970 Referring Provider (PT): Simona Huh, NP   Encounter Date: 02/16/2019  PT End of Session - 02/16/19 1112    Visit Number  10    Date for PT Re-Evaluation  03/30/19    Authorization Type  MCD re-evaluation for 12 more visits on 02/02/2019 (2x week for 6 weeks)    Authorization Time Period  11/3-12/14    Authorization - Visit Number  2    Authorization - Number of Visits  12    PT Start Time  1104    PT Stop Time  1157    PT Time Calculation (min)  53 min    Activity Tolerance  Patient tolerated treatment well    Behavior During Therapy  [facility] for tasks assessed/performed       Past Medical History:  Diagnosis Date  . Anxiety   . Chronic headaches   . Colon polyps   . Depression   . Fibromyalgia   . Hyperlipidemia   . IBS (irritable bowel syndrome)   . Mitral valve prolapse   . SVT (supraventricular tachycardia) ([place])   . UTI (urinary tract infection)   . Vitamin D deficiency     Past Surgical History:  Procedure Laterality Date  . abdominal laproscopic surgery    . BREAST BIOPSY Right   . BREAST EXCISIONAL BIOPSY Right    x3  . BREAST EXCISIONAL BIOPSY Left   . FOOT SURGERY    . TUBAL LIGATION    . WISDOM TOOTH EXTRACTION      There were no vitals filed for this visit.  Subjective Assessment - 02/16/19 1110    Subjective  I am hurting so bad, I know it's the rain but I can't move any part of my lower back without it hurting. DN made me feel different, a little bruis-y. Tub exercises were great. I did not have all of my medicine over the weekend- used lots of heat and TENS but I was not laying there in agony.    Patient Stated Goals  get the pain more managed    Currently in Pain?  Yes    Pain Score  --    severe                      [facility] Adult PT Treatment/Exercise - 02/16/19 0001      Lumbar Exercises: Stretches   Lower Trunk Rotation Limitations  3 times each side 3 deep breaths holds    Figure 4 Stretch Limitations  both 30s      Lumbar Exercises: Aerobic   Nustep  10 min L5 UE & LE      Lumbar Exercises: Quadruped   Other Quadruped Lumbar Exercises   mini rocking      Moist Heat Therapy   Number Minutes Moist Heat  15 Minutes    Moist Heat Location  Lumbar Spine      Manual Therapy   Manual therapy comments  skilled palpation and monitoring during TPDN    Soft tissue mobilization  lumbar paraspinals, Rt glut max, Lt glut med/min       Trigger Point Dry Needling - 02/16/19 0001    Dry Needling Comments  Rt L4 paraspinals    Gluteus Medius Response  Twitch response elicited;Palpable increased muscle  length   Lt   Gluteus Maximus Response  Twitch response elicited;Palpable increased muscle length   Rt            PT Short Term Goals - 02/02/19 1229      PT SHORT TERM GOAL #1   Title  Pt will be I and compliant with HEP 4 weeks 12/03/18    Baseline  pt reporting she has been doing all her stretches issued at her last visit    Period  Weeks    Status  Achieved      PT SHORT TERM GOAL #2   Title  Pt will report 2 ways of managing her pain at home other than with pain meds    Baseline  pt tries other ways, but ends up having to take pain meds to get any relief    Period  Weeks    Status  On-going        PT Long Term Goals - 02/02/19 1230      PT LONG TERM GOAL #1   Title  Pt will reduce pain overall 50% (target for all LTG 12 weeks 02/07/2019)    Baseline  Pt still reporting 8/10 LBP and 6/10 left hip pain today upon arrival    Time  8    Period  Weeks    Status  On-going      PT LONG TERM GOAL #2   Title  Pt will improve ROM to [facility] to improve mobility.    Baseline  50% flexion, ext and rotation    Time  8    Period  Weeks    Status   On-going      PT LONG TERM GOAL #3   Title  Pt will improve strength to at least 5-/5 MMT to improve function    Baseline  5/5 MMT    Time  8    Period  Weeks    Status  Achieved      PT LONG TERM GOAL #4   Title  Pt will improve overall activity tolerance with standing and walking to at least 30 minutes for ADL's    Baseline  Pt reporting that she is able to stand/walk intermittenlty for periods of 20-30 minutes, but it is very inconsitent and usually reports pain.    Period  Weeks    Status  On-going            Plan - 02/16/19 1142    Clinical Impression Statement  Pt reported significant reduction in pain following treatment today. DN created twitch response and decreased palpable tension. Pt will be in the car for a few hours today and I asked her to get out frequently.    PT Treatment/Interventions  Aquatic Therapy;Electrical Stimulation;Moist Heat;Traction;Ultrasound;Therapeutic activities;Therapeutic exercise;Neuromuscular re-education;Manual techniques;Passive range of motion;Dry needling;Spinal Manipulations;Joint Manipulations;Taping    PT Next Visit Plan  continue with DN, add core engagement exercises    PT Home Exercise Plan  Access Code: [email or web address], verbally added hip adduction with ball, tub exercises.    Consulted and Agree with Plan of Care  Patient       Patient will benefit from skilled therapeutic intervention in order to improve the following deficits and impairments:  Decreased activity tolerance, Decreased endurance, Decreased range of motion, Decreased strength, Difficulty walking, Postural dysfunction, Pain, Increased muscle spasms  Visit Diagnosis: Chronic bilateral low back pain with bilateral sciatica  Muscle weakness (generalized)     Problem List Patient Active Problem List  Diagnosis Date Noted  . MDD (major depressive disorder), recurrent episode, moderate ([place]) 04/17/2017    Juel Bellerose C. Quinnton Bury PT, DPT 02/16/19 12:27 PM   [facility] [address] [place], [place], [address] Phone: [phone]   Fax:  [phone]  Name: Chelsea Cherry MRN: [id number] Date of Birth: [birth year]

## 2019-02-23 ENCOUNTER — Other Ambulatory Visit: Payer: Self-pay

## 2019-02-23 ENCOUNTER — Encounter: Payer: Self-pay | Admitting: Physical Therapy

## 2019-02-23 ENCOUNTER — Ambulatory Visit: Payer: Medicaid Other | Admitting: Physical Therapy

## 2019-02-23 DIAGNOSIS — M5442 Lumbago with sciatica, left side: Secondary | ICD-10-CM

## 2019-02-23 DIAGNOSIS — M6281 Muscle weakness (generalized): Secondary | ICD-10-CM

## 2019-02-23 DIAGNOSIS — G8929 Other chronic pain: Secondary | ICD-10-CM

## 2019-02-23 NOTE — Therapy (Signed)
[place], [place], [address] Phone: [phone]   Fax:  [phone]  Physical Therapy Treatment  Patient Details  Name: Chelsea Cherry MRN: [id number] Date of Birth: 1970-11-21 Referring Provider (PT): Simona Huh, NP   Encounter Date: 02/23/2019  PT End of Session - 02/23/19 1321    Visit Number  11    Number of Visits  20    Date for PT Re-Evaluation  03/30/19    Authorization Type  MCD re-evaluation for 12 more visits on 02/02/2019 (2x week for 6 weeks)    Authorization Time Period  11/3-12/14    Authorization - Visit Number  3    Authorization - Number of Visits  12    PT Start Time  [id number]    PT Stop Time  1401    PT Time Calculation (min)  40 min    Activity Tolerance  Patient tolerated treatment well       Past Medical History:  Diagnosis Date  . Anxiety   . Chronic headaches   . Colon polyps   . Depression   . Fibromyalgia   . Hyperlipidemia   . IBS (irritable bowel syndrome)   . Mitral valve prolapse   . SVT (supraventricular tachycardia) ([place])   . UTI (urinary tract infection)   . Vitamin D deficiency     Past Surgical History:  Procedure Laterality Date  . abdominal laproscopic surgery    . BREAST BIOPSY Right   . BREAST EXCISIONAL BIOPSY Right    x3  . BREAST EXCISIONAL BIOPSY Left   . FOOT SURGERY    . TUBAL LIGATION    . WISDOM TOOTH EXTRACTION      There were no vitals filed for this visit.  Subjective Assessment - 02/23/19 1322    Subjective  Pt reports she had cool sculpting this AM so she is still numb in the lower abdominal muscles.   She has a band/brace around her lower abdomin and will be wearing this until she feels comfortable without. She is going to go ahead and have the SIJ injections - is waiting to hear from the office to schedule.    Currently in Pain?  Yes    Pain Score  8     Pain Location  Back    Pain Orientation  Lower    Pain Descriptors /  Indicators  Aching;Sore    Pain Type  Chronic pain    Pain Radiating Towards  into hip - she will be having SIJ injections    Pain Onset  More than a month ago    Aggravating Factors   movement    Pain Relieving Factors  meds take the edge off                       [facility] Adult PT Treatment/Exercise - 02/23/19 0001      Exercises   Exercises  Lumbar      Lumbar Exercises: Stretches   Single Knee to Chest Stretch  3 reps;30 seconds    Lower Trunk Rotation Limitations  3 times each side 3 deep breaths holds      Lumbar Exercises: Aerobic   Nustep  15 min L5 UE & LE   while discussing pts POC and progress     Lumbar Exercises: Supine   Clam  10 reps    Clam Limitations  single leg clams with opposite leg straight and TA engagement  Bridge  --   2x10   Large Ball Abdominal Isometric  10 reps;5 seconds   hooklying, straight presses and diagonal               PT Short Term Goals - 02/02/19 1229      PT SHORT TERM GOAL #1   Title  Pt will be I and compliant with HEP 4 weeks 12/03/18    Baseline  pt reporting she has been doing all her stretches issued at her last visit    Period  Weeks    Status  Achieved      PT SHORT TERM GOAL #2   Title  Pt will report 2 ways of managing her pain at home other than with pain meds    Baseline  pt tries other ways, but ends up having to take pain meds to get any relief    Period  Weeks    Status  On-going        PT Long Term Goals - 02/02/19 1230      PT LONG TERM GOAL #1   Title  Pt will reduce pain overall 50% (target for all LTG 12 weeks 02/07/2019)    Baseline  Pt still reporting 8/10 LBP and 6/10 left hip pain today upon arrival    Time  8    Period  Weeks    Status  On-going      PT LONG TERM GOAL #2   Title  Pt will improve ROM to [facility] to improve mobility.    Baseline  50% flexion, ext and rotation    Time  8    Period  Weeks    Status  On-going      PT LONG TERM GOAL #3   Title  Pt will improve  strength to at least 5-/5 MMT to improve function    Baseline  5/5 MMT    Time  8    Period  Weeks    Status  Achieved      PT LONG TERM GOAL #4   Title  Pt will improve overall activity tolerance with standing and walking to at least 30 minutes for ADL's    Baseline  Pt reporting that she is able to stand/walk intermittenlty for periods of 20-30 minutes, but it is very inconsitent and usually reports pain.    Period  Weeks    Status  On-going            Plan - 02/23/19 1341    Clinical Impression Statement  Todays focus was on gentle core engagement and activity tolerance.  She had cool sculpting right before tx and was limited in her activity tolerance. She is going to schedule SIJ injections when the office calls her.    Rehab Potential  Good    PT Frequency  2x / week    PT Duration  6 weeks    PT Treatment/Interventions  Aquatic Therapy;Electrical Stimulation;Moist Heat;Traction;Ultrasound;Therapeutic activities;Therapeutic exercise;Neuromuscular re-education;Manual techniques;Passive range of motion;Dry needling;Spinal Manipulations;Joint Manipulations;Taping    PT Next Visit Plan  continue with DN as needed, add core engagement exercises       Patient will benefit from skilled therapeutic intervention in order to improve the following deficits and impairments:  Decreased activity tolerance, Decreased endurance, Decreased range of motion, Decreased strength, Difficulty walking, Postural dysfunction, Pain, Increased muscle spasms  Visit Diagnosis: Chronic bilateral low back pain with bilateral sciatica  Muscle weakness (generalized)     Problem List Patient Active Problem  List   Diagnosis Date Noted  . MDD (major depressive disorder), recurrent episode, moderate ([place]) 04/17/2017    Jeral Pinch PT  02/23/2019, 2:01 PM  [facility] [address] [place], [place], [address] Phone: [phone]   Fax:   [phone]  Name: Chelsea Cherry MRN: [id number] Date of Birth: 1971-03-28

## 2019-03-01 ENCOUNTER — Other Ambulatory Visit: Payer: Self-pay

## 2019-03-01 ENCOUNTER — Ambulatory Visit: Payer: Medicaid Other | Admitting: Physical Therapy

## 2019-03-01 ENCOUNTER — Encounter: Payer: Self-pay | Admitting: Physical Therapy

## 2019-03-01 DIAGNOSIS — M5442 Lumbago with sciatica, left side: Secondary | ICD-10-CM

## 2019-03-01 DIAGNOSIS — M6281 Muscle weakness (generalized): Secondary | ICD-10-CM

## 2019-03-01 DIAGNOSIS — G8929 Other chronic pain: Secondary | ICD-10-CM

## 2019-03-01 NOTE — Therapy (Signed)
[place], [place], [address] Phone: [phone]   Fax:  [phone]  Physical Therapy Treatment  Patient Details  Name: Chelsea Cherry MRN: [id number] Date of Birth: 03-14-1971 Referring Provider (PT): Simona Huh, NP   Encounter Date: 03/01/2019  PT End of Session - 03/01/19 1159    Visit Number  12    Number of Visits  20    Date for PT Re-Evaluation  03/30/19    Authorization Type  MCD re-evaluation for 12 more visits on 02/02/2019 (2x week for 6 weeks)    Authorization Time Period  11/3-12/14    Authorization - Visit Number  3    Authorization - Number of Visits  12    PT Start Time  [id number]    PT Stop Time  1215    PT Time Calculation (min)  30 min    Activity Tolerance  Patient tolerated treatment well    Behavior During Therapy  [facility] for tasks assessed/performed       Past Medical History:  Diagnosis Date  . Anxiety   . Chronic headaches   . Colon polyps   . Depression   . Fibromyalgia   . Hyperlipidemia   . IBS (irritable bowel syndrome)   . Mitral valve prolapse   . SVT (supraventricular tachycardia) ([place])   . UTI (urinary tract infection)   . Vitamin D deficiency     Past Surgical History:  Procedure Laterality Date  . abdominal laproscopic surgery    . BREAST BIOPSY Right   . BREAST EXCISIONAL BIOPSY Right    x3  . BREAST EXCISIONAL BIOPSY Left   . FOOT SURGERY    . TUBAL LIGATION    . WISDOM TOOTH EXTRACTION      There were no vitals filed for this visit.  Subjective Assessment - 03/01/19 1150    Subjective  Pt reports having another round of cool sculpting yesterday. Pt reporting increased abdominal pain, but wants to work though therapy session. Pt arriving today 15 minutes late. Session will be shortened accordingly.    Pertinent History  [id number] headaches, fibromyalgia,    Limitations  Sitting;Walking;Lifting;Standing    How long can you sit comfortably?   depends    How long can you stand comfortably?  5-10  min    Diagnostic tests  Lumbar MRI 05/2018 "Trace anterolisthesis of L4 on L5 with associated advanced facetarthropathy, with resultant mild bilateral lateral recess stenosis. Facet disease at this level could contribute to lower back pain. Additional DDD and facet hypertrophy at L3-4 and L5-S1 without stenosis.    Patient Stated Goals  get the pain more managed    Currently in Pain?  Yes    Pain Score  7     Pain Location  Back    Pain Orientation  Lower    Pain Descriptors / Indicators  Sore    Pain Type  Chronic pain    Pain Radiating Towards  Pt still waiting to schedule her SIJ injections    Pain Onset  More than a month ago    Pain Frequency  Intermittent    Multiple Pain Sites  Yes    Pain Score  9    Pain Location  Abdomen    Pain Orientation  Lower    Pain Descriptors / Indicators  Sore    Pain Type  Acute pain    Pain Radiating Towards  abdominals due to cool scupting    Pain Frequency  Constant                       [facility] Adult PT Treatment/Exercise - 03/01/19 0001      Exercises   Exercises  Lumbar      Lumbar Exercises: Stretches   Single Knee to Chest Stretch  3 reps;30 seconds    Lower Trunk Rotation Limitations  3 times each side 3 deep breaths holds      Lumbar Exercises: Aerobic   Nustep  5 minutes UE/LE      Lumbar Exercises: Supine   Clam  10 reps    Clam Limitations  single leg clams with opposite leg straight and TA engagement    Dead Bug  10 reps;Limitations    Dead Bug Limitations  modified    Bridge  10 reps;5 seconds    Large Ball Abdominal Isometric  10 reps;5 seconds   hooklying, straight presses and diagonal               PT Short Term Goals - 02/02/19 1229      PT SHORT TERM GOAL #1   Title  Pt will be I and compliant with HEP 4 weeks 12/03/18    Baseline  pt reporting she has been doing all her stretches issued at her last visit    Period  Weeks    Status   Achieved      PT SHORT TERM GOAL #2   Title  Pt will report 2 ways of managing her pain at home other than with pain meds    Baseline  pt tries other ways, but ends up having to take pain meds to get any relief    Period  Weeks    Status  On-going        PT Long Term Goals - 02/02/19 1230      PT LONG TERM GOAL #1   Title  Pt will reduce pain overall 50% (target for all LTG 12 weeks 02/07/2019)    Baseline  Pt still reporting 8/10 LBP and 6/10 left hip pain today upon arrival    Time  8    Period  Weeks    Status  On-going      PT LONG TERM GOAL #2   Title  Pt will improve ROM to [facility] to improve mobility.    Baseline  50% flexion, ext and rotation    Time  8    Period  Weeks    Status  On-going      PT LONG TERM GOAL #3   Title  Pt will improve strength to at least 5-/5 MMT to improve function    Baseline  5/5 MMT    Time  8    Period  Weeks    Status  Achieved      PT LONG TERM GOAL #4   Title  Pt will improve overall activity tolerance with standing and walking to at least 30 minutes for ADL's    Baseline  Pt reporting that she is able to stand/walk intermittenlty for periods of 20-30 minutes, but it is very inconsitent and usually reports pain.    Period  Weeks    Status  On-going            Plan - 03/01/19 1200    Clinical Impression Statement  Today we focused on gentle core exercises due to cool sculpting pain from yesterday's treatment. Pt presenting with 9/10 lower abdominal pain and 7/10  low back pain and left hip pain. Pt still waiting on MD office to call to schedule SIJ injection. Continue skilled PT. Pt was informed her MCD certificaiton expires on 03/21/2019. Pt reporting she is having diffculty getting 2 appointment scheduled in a week.    Personal Factors and Comorbidities  Fitness;Comorbidity 1;Comorbidity 2    Comorbidities  [id number] headaches, fibromyalgia,    Examination-Activity Limitations  Locomotion Level;Squat;Stairs;Stand;Lift     Examination-Participation Restrictions  Meal Prep;Community Activity;Laundry;Shop    Stability/Clinical Decision Making  Evolving/Moderate complexity    Rehab Potential  Good    PT Frequency  2x / week    PT Duration  6 weeks    PT Treatment/Interventions  Aquatic Therapy;Electrical Stimulation;Moist Heat;Traction;Ultrasound;Therapeutic activities;Therapeutic exercise;Neuromuscular re-education;Manual techniques;Passive range of motion;Dry needling;Spinal Manipulations;Joint Manipulations;Taping    PT Next Visit Plan  continue with DN as needed, add core engagement exercises    PT Home Exercise Plan  Access Code: [email or web address], verbally added hip adduction with ball, tub exercises.    Consulted and Agree with Plan of Care  Patient       Patient will benefit from skilled therapeutic intervention in order to improve the following deficits and impairments:  Decreased activity tolerance, Decreased endurance, Decreased range of motion, Decreased strength, Difficulty walking, Postural dysfunction, Pain, Increased muscle spasms  Visit Diagnosis: Chronic bilateral low back pain with bilateral sciatica  Muscle weakness (generalized)     Problem List Patient Active Problem List   Diagnosis Date Noted  . MDD (major depressive disorder), recurrent episode, moderate ([place]) 04/17/2017    Oretha Caprice, PT 03/01/2019, 12:10 PM  [facility] [address] [place], [place], [address] Phone: [phone]   Fax:  [phone]  Name: Chelsea Cherry MRN: [id number] Date of Birth: March 05, 1971

## 2019-03-08 ENCOUNTER — Ambulatory Visit: Payer: Medicaid Other | Attending: Nurse Practitioner | Admitting: Physical Therapy

## 2019-03-08 ENCOUNTER — Encounter: Payer: Self-pay | Admitting: Physical Therapy

## 2019-03-08 ENCOUNTER — Other Ambulatory Visit: Payer: Self-pay

## 2019-03-08 DIAGNOSIS — M5442 Lumbago with sciatica, left side: Secondary | ICD-10-CM | POA: Diagnosis not present

## 2019-03-08 DIAGNOSIS — M5441 Lumbago with sciatica, right side: Secondary | ICD-10-CM | POA: Insufficient documentation

## 2019-03-08 DIAGNOSIS — M6281 Muscle weakness (generalized): Secondary | ICD-10-CM | POA: Insufficient documentation

## 2019-03-08 DIAGNOSIS — G8929 Other chronic pain: Secondary | ICD-10-CM | POA: Insufficient documentation

## 2019-03-08 NOTE — Therapy (Signed)
[place], [place], [address] Phone: [phone]   Fax:  [phone]  Physical Therapy Treatment  Patient Details  Name: Chelsea Cherry MRN: [id number] Date of Birth: 1971/02/07 Referring Provider (PT): Simona Huh, NP   Encounter Date: 03/08/2019  PT End of Session - 03/08/19 1426    Visit Number  13    Number of Visits  20    Date for PT Re-Evaluation  03/30/19    Authorization Type  MCD re-evaluation for 12 more visits on 02/02/2019 (2x week for 6 weeks)    Authorization Time Period  11/3-12/14    Authorization - Visit Number  4    Authorization - Number of Visits  12    PT Start Time  [id number]    PT Stop Time  1510    PT Time Calculation (min)  48 min    Activity Tolerance  Patient tolerated treatment well    Behavior During Therapy  [facility] for tasks assessed/performed       Past Medical History:  Diagnosis Date  . Anxiety   . Chronic headaches   . Colon polyps   . Depression   . Fibromyalgia   . Hyperlipidemia   . IBS (irritable bowel syndrome)   . Mitral valve prolapse   . SVT (supraventricular tachycardia) ([place])   . UTI (urinary tract infection)   . Vitamin D deficiency     Past Surgical History:  Procedure Laterality Date  . abdominal laproscopic surgery    . BREAST BIOPSY Right   . BREAST EXCISIONAL BIOPSY Right    x3  . BREAST EXCISIONAL BIOPSY Left   . FOOT SURGERY    . TUBAL LIGATION    . WISDOM TOOTH EXTRACTION      There were no vitals filed for this visit.  Subjective Assessment - 03/08/19 1425    Subjective  My abdomen really hurts after doing cool sculpting. Dry needling is very helpful.    Patient Stated Goals  get the pain more managed                       [facility] Adult PT Treatment/Exercise - 03/08/19 0001      Lumbar Exercises: Stretches   Quadruped Mid Back Stretch Limitations  child pose    Other Lumbar Stretch Exercise  cat/camel      Lumbar Exercises: Aerobic   Nustep  5 min Le5 UE & LE      Lumbar Exercises: Standing   Other Standing Lumbar Exercises  kneeling physioball lift to 90- cues for core      Lumbar Exercises: Quadruped   Other Quadruped Lumbar Exercises  plank roll out on physioball & knees    Other Quadruped Lumbar Exercises  plank press out on knees & hands on physioball      Moist Heat Therapy   Number Minutes Moist Heat  10 Minutes   with education   Moist Heat Location  Lumbar Spine      Manual Therapy   Manual Therapy  Joint mobilization    Manual therapy comments  skilled palpation and monitoring during TPDN    Joint Mobilization  Rt SIJ PA    Soft tissue mobilization  bil glut max & med       Trigger Point Dry Needling - 03/08/19 0001    Other Dry Needling  piriformis Rt    Gluteus Medius Response  Twitch response elicited;Palpable increased muscle length  bil   Gluteus Maximus Response  Twitch response elicited;Palpable increased muscle length   bil            PT Short Term Goals - 02/02/19 1229      PT SHORT TERM GOAL #1   Title  Pt will be I and compliant with HEP 4 weeks 12/03/18    Baseline  pt reporting she has been doing all her stretches issued at her last visit    Period  Weeks    Status  Achieved      PT SHORT TERM GOAL #2   Title  Pt will report 2 ways of managing her pain at home other than with pain meds    Baseline  pt tries other ways, but ends up having to take pain meds to get any relief    Period  Weeks    Status  On-going        PT Long Term Goals - 02/02/19 1230      PT LONG TERM GOAL #1   Title  Pt will reduce pain overall 50% (target for all LTG 12 weeks 02/07/2019)    Baseline  Pt still reporting 8/10 LBP and 6/10 left hip pain today upon arrival    Time  8    Period  Weeks    Status  On-going      PT LONG TERM GOAL #2   Title  Pt will improve ROM to [facility] to improve mobility.    Baseline  50% flexion, ext and rotation    Time  8    Period   Weeks    Status  On-going      PT LONG TERM GOAL #3   Title  Pt will improve strength to at least 5-/5 MMT to improve function    Baseline  5/5 MMT    Time  8    Period  Weeks    Status  Achieved      PT LONG TERM GOAL #4   Title  Pt will improve overall activity tolerance with standing and walking to at least 30 minutes for ADL's    Baseline  Pt reporting that she is able to stand/walk intermittenlty for periods of 20-30 minutes, but it is very inconsitent and usually reports pain.    Period  Weeks    Status  On-going            Plan - 03/08/19 1500    Clinical Impression Statement  dry needling again today bilaterally with largest twitches noted on Rt side. soreness following needling as expected. used physioball for plank motions and equating them to functional motions.    PT Treatment/Interventions  Aquatic Therapy;Electrical Stimulation;Moist Heat;Traction;Ultrasound;Therapeutic activities;Therapeutic exercise;Neuromuscular re-education;Manual techniques;Passive range of motion;Dry needling;Spinal Manipulations;Joint Manipulations;Taping    PT Next Visit Plan  continue with physioball exercises    PT Home Exercise Plan  Access Code: [email or web address], verbally added hip adduction with ball, tub exercises.    Consulted and Agree with Plan of Care  Patient       Patient will benefit from skilled therapeutic intervention in order to improve the following deficits and impairments:  Decreased activity tolerance, Decreased endurance, Decreased range of motion, Decreased strength, Difficulty walking, Postural dysfunction, Pain, Increased muscle spasms  Visit Diagnosis: Chronic bilateral low back pain with bilateral sciatica  Muscle weakness (generalized)     Problem List Patient Active Problem List   Diagnosis Date Noted  . MDD (major depressive disorder), recurrent episode, moderate ([place]) 04/17/2017  Loui Massenburg C. Abagael Kramm PT, DPT 03/08/19 3:03 PM   [place] [facility] [address] [place], [place], [address] Phone: [phone]   Fax:  [phone]  Name: Chelsea Cherry MRN: [id number] Date of Birth: 24-Dec-1970

## 2019-03-10 ENCOUNTER — Other Ambulatory Visit: Payer: Self-pay

## 2019-03-10 ENCOUNTER — Ambulatory Visit: Payer: Medicaid Other | Admitting: Physical Therapy

## 2019-03-10 ENCOUNTER — Encounter: Payer: Self-pay | Admitting: Physical Therapy

## 2019-03-10 DIAGNOSIS — M5442 Lumbago with sciatica, left side: Secondary | ICD-10-CM

## 2019-03-10 DIAGNOSIS — G8929 Other chronic pain: Secondary | ICD-10-CM

## 2019-03-10 DIAGNOSIS — M6281 Muscle weakness (generalized): Secondary | ICD-10-CM

## 2019-03-10 NOTE — Therapy (Signed)
[place], [place], [address] Phone: [phone]   Fax:  [phone]  Physical Therapy Treatment  Patient Details  Name: Chelsea Cherry MRN: [id number] Date of Birth: 01-23-1971 Referring Provider (Cherry): Chelsea Huh, NP   Encounter Date: 03/10/2019  Cherry End of Session - 03/10/19 1422    Visit Number  14    Number of Visits  20    Date for Cherry Re-Evaluation  03/30/19    Authorization Type  MCD re-evaluation for 12 more visits on 02/02/2019 (2x week for 6 weeks)    Authorization Time Period  11/3-12/14    Authorization - Visit Number  5    Authorization - Number of Visits  12    Cherry Start Time  [id number]    Cherry Stop Time  1454    Cherry Time Calculation (min)  39 min    Activity Tolerance  Patient tolerated treatment well    Behavior During Therapy  [facility] for tasks assessed/performed       Past Medical History:  Diagnosis Date  . Anxiety   . Chronic headaches   . Colon polyps   . Depression   . Fibromyalgia   . Hyperlipidemia   . IBS (irritable bowel syndrome)   . Mitral valve prolapse   . SVT (supraventricular tachycardia) ([place])   . UTI (urinary tract infection)   . Vitamin D deficiency     Past Surgical History:  Procedure Laterality Date  . abdominal laproscopic surgery    . BREAST BIOPSY Right   . BREAST EXCISIONAL BIOPSY Right    x3  . BREAST EXCISIONAL BIOPSY Left   . FOOT SURGERY    . TUBAL LIGATION    . WISDOM TOOTH EXTRACTION      There were no vitals filed for this visit.  Subjective Assessment - 03/10/19 1422    Subjective  Rt buttock was very sore but is better. Stomach is better. finally turned the corner in weight loss.    Patient Stated Goals  get the pain more managed                       [facility] Adult Cherry Treatment/Exercise - 03/10/19 0001      Lumbar Exercises: Stretches   Passive Hamstring Stretch Limitations  seated EOB    Quadruped Mid Back Stretch  Limitations  child pose on physioball    Other Lumbar Stretch Exercise  lat stretch with physioball    Other Lumbar Stretch Exercise  seated physioball rollout stretch      Lumbar Exercises: Aerobic   Nustep  10 min UE & LE L6      Lumbar Exercises: Seated   Other Seated Lumbar Exercises  physioball press               Cherry Short Term Goals - 02/02/19 1229      Cherry SHORT TERM GOAL #1   Title  Cherry will be I and compliant with HEP 4 weeks 12/03/18    Baseline  Cherry reporting she has been doing all her stretches issued at her last visit    Period  Weeks    Status  Achieved      Cherry SHORT TERM GOAL #2   Title  Cherry will report 2 ways of managing her pain at home other than with pain meds    Baseline  Cherry tries other ways, but ends up having to take pain meds to get  any relief    Period  Weeks    Status  On-going        Cherry Long Term Goals - 02/02/19 1230      Cherry LONG TERM GOAL #1   Title  Cherry will reduce pain overall 50% (target for all LTG 12 weeks 02/07/2019)    Baseline  Cherry still reporting 8/10 LBP and 6/10 left hip pain today upon arrival    Time  8    Period  Weeks    Status  On-going      Cherry LONG TERM GOAL #2   Title  Cherry will improve ROM to [facility] to improve mobility.    Baseline  50% flexion, ext and rotation    Time  8    Period  Weeks    Status  On-going      Cherry LONG TERM GOAL #3   Title  Cherry will improve strength to at least 5-/5 MMT to improve function    Baseline  5/5 MMT    Time  8    Period  Weeks    Status  Achieved      Cherry LONG TERM GOAL #4   Title  Cherry will improve overall activity tolerance with standing and walking to at least 30 minutes for ADL's    Baseline  Cherry reporting that she is able to stand/walk intermittenlty for periods of 20-30 minutes, but it is very inconsitent and usually reports pain.    Period  Weeks    Status  On-going            Plan - 03/10/19 1455    Clinical Impression Statement  Progressed HEP using physioball today which was  well tolerated. Cues for core engagement- praying mantis created pressure at superior aspect of rectus abdominis so that one will be held for later.    Cherry Treatment/Interventions  Aquatic Therapy;Electrical Stimulation;Moist Heat;Traction;Ultrasound;Therapeutic activities;Therapeutic exercise;Neuromuscular re-education;Manual techniques;Passive range of motion;Dry needling;Spinal Manipulations;Joint Manipulations;Taping    Cherry Next Visit Plan  check need for further DN, hip abductor strengthening    Cherry Home Exercise Plan  Access Code: [email or web address], verbally added hip adduction with ball, tub exercises., physioball exercises    Consulted and Agree with Plan of Care  Patient       Patient will benefit from skilled therapeutic intervention in order to improve the following deficits and impairments:  Decreased activity tolerance, Decreased endurance, Decreased range of motion, Decreased strength, Difficulty walking, Postural dysfunction, Pain, Increased muscle spasms  Visit Diagnosis: Chronic bilateral low back pain with bilateral sciatica  Muscle weakness (generalized)     Problem List Patient Active Problem List   Diagnosis Date Noted  . MDD (major depressive disorder), recurrent episode, moderate ([place]) 04/17/2017    Chelsea Cherry C. Chelsea Cherry, DPT 03/10/19 2:57 PM   [facility] [address] [place], [place], [address] Phone: [phone]   Fax:  [phone]  Name: Chelsea Cherry MRN: [id number] Date of Birth: 02-25-1971

## 2019-03-15 ENCOUNTER — Ambulatory Visit: Payer: Medicaid Other | Admitting: Physical Therapy

## 2019-03-17 ENCOUNTER — Other Ambulatory Visit: Payer: Self-pay

## 2019-03-17 ENCOUNTER — Ambulatory Visit: Payer: Medicaid Other | Admitting: Physical Therapy

## 2019-03-17 ENCOUNTER — Encounter: Payer: Self-pay | Admitting: Physical Therapy

## 2019-03-17 DIAGNOSIS — M5442 Lumbago with sciatica, left side: Secondary | ICD-10-CM | POA: Diagnosis not present

## 2019-03-17 DIAGNOSIS — M6281 Muscle weakness (generalized): Secondary | ICD-10-CM

## 2019-03-17 NOTE — Therapy (Signed)
[place], [place], [address] Phone: [phone]   Fax:  [phone]  Physical Therapy Treatment/RE-evaluation  Patient Details  Name: Chelsea Cherry MRN: [id number] Date of Birth: December 25, 1970 Referring Provider (PT): Simona Huh, NP   Encounter Date: 03/17/2019  PT End of Session - 03/17/19 1448    Visit Number  15    Number of Visits  20    Date for PT Re-Evaluation  03/30/19    Authorization Type  MCD re-evaluation for 12 more visits on 02/02/2019 (2x week for 6 weeks)    Authorization Time Period  11/3-12/14    Authorization - Visit Number  6    Authorization - Number of Visits  12    PT Start Time  [id number]    PT Stop Time  1459    PT Time Calculation (min)  45 min    Activity Tolerance  Patient tolerated treatment well    Behavior During Therapy  [facility] for tasks assessed/performed       Past Medical History:  Diagnosis Date  . Anxiety   . Chronic headaches   . Colon polyps   . Depression   . Fibromyalgia   . Hyperlipidemia   . IBS (irritable bowel syndrome)   . Mitral valve prolapse   . SVT (supraventricular tachycardia) ([place])   . UTI (urinary tract infection)   . Vitamin D deficiency     Past Surgical History:  Procedure Laterality Date  . abdominal laproscopic surgery    . BREAST BIOPSY Right   . BREAST EXCISIONAL BIOPSY Right    x3  . BREAST EXCISIONAL BIOPSY Left   . FOOT SURGERY    . TUBAL LIGATION    . WISDOM TOOTH EXTRACTION      There were no vitals filed for this visit.  Subjective Assessment - 03/17/19 1445    Subjective  I went 2 days without medication and was miserable. I am getting my new shoes for christmas.    Diagnostic tests  Lumbar MRI 05/2018 "Trace anterolisthesis of L4 on L5 with associated advanced facetarthropathy, with resultant mild bilateral lateral recess stenosis. Facet disease at this level could contribute to lower back pain. Additional DDD and  facet hypertrophy at L3-4 and L5-S1 without stenosis.    Patient Stated Goals  get the pain more managed    Currently in Pain?  Yes    Pain Score  --   moderate   Pain Location  Hip    Pain Orientation  Left    Pain Descriptors / Indicators  Sore    Aggravating Factors   walking too long, standing too long    Pain Relieving Factors  pain meds         [facility] PT Assessment - 03/17/19 0001      Assessment   Medical Diagnosis  lumbar pain with radiculopathy    Referring Provider (PT)  Simona Huh, NP      Balance Screen   Has the patient fallen in the past 6 months  No      Prior Function   Level of Independence  Independent      Cognition   Overall Cognitive Status  Within Functional Limits for tasks assessed      Sensation   Additional Comments  N/T with standing 10-15 min+      Posture/Postural Control   Posture Comments  proper static, resting posture but requires cues for dynamic posture in gait  AROM   Overall AROM Comments  lumbar WFL with moderate discomfort      Strength   Overall Strength Comments  open chain MMT 5/5      Palpation   SI assessment   crepitus in lumbar spine and SIJs in flexion/extension      Ambulation/Gait   Gait Comments  + Lt trendelenburg                   [facility] Adult PT Treatment/Exercise - 03/17/19 0001      Lumbar Exercises: Stretches   Passive Hamstring Stretch Limitations  seated EOB    Piriformis Stretch Limitations  seated EOB      Lumbar Exercises: Aerobic   Nustep  10 min UE & LE L6      Lumbar Exercises: Standing   Other Standing Lumbar Exercises  hip hike, T hinge    Other Standing Lumbar Exercises  gait training- treadmil: wide stance, Rt hip lift in swing through             PT Education - 03/17/19 1605    Education Details  goals, progress, discussed placebo affect that she will discuss with MD, gait pattern-reviewed video    Person(s) Educated  Patient    Methods   Explanation;Demonstration;Tactile cues;Verbal cues    Comprehension  Verbalized understanding;Need further instruction;Returned demonstration;Verbal cues required;Tactile cues required       PT Short Term Goals - 02/02/19 1229      PT SHORT TERM GOAL #1   Title  Pt will be I and compliant with HEP 4 weeks 12/03/18    Baseline  pt reporting she has been doing all her stretches issued at her last visit    Period  Weeks    Status  Achieved      PT SHORT TERM GOAL #2   Title  Pt will report 2 ways of managing her pain at home other than with pain meds    Baseline  pt tries other ways, but ends up having to take pain meds to get any relief    Period  Weeks    Status  On-going        PT Long Term Goals - 03/17/19 1450      PT LONG TERM GOAL #1   Title  Pt will reduce pain overall 50% (target for all LTG 12 weeks 02/07/2019)    Baseline  avg 6/10 with ADLs, Average was 8/10    Status  On-going      PT LONG TERM GOAL #2   Title  Pt will improve ROM to [facility] to improve mobility.    Baseline  50% flexion, ext and rotation    Status  Achieved      PT LONG TERM GOAL #3   Title  Pt will improve strength to at least 5-/5 MMT to improve function    Baseline  5/5 MMT    Status  Achieved      PT LONG TERM GOAL #4   Title  Pt will improve overall activity tolerance with standing and walking to at least 30 minutes for ADL's    Baseline  average ability of 10 min on a walk    Status  On-going      PT LONG TERM GOAL #5   Title  Able to ambulate without trendelenburg    Baseline  Lt + Trendelenburg    Status  New            Plan -  03/17/19 1600    Clinical Impression Statement  Pt has made improvements in strength and has generally decreased pain in ADLs, except for the pain in the last 2 days with no pain meds. Notable gait deviations specified in flowsheet with benefit from further training in order to decrease abnormal pressures on hip. Did very well with cuing today and correcting  gait with reminders. Pt will continue to benefit from skilled PT in order to address continued deficits and continue to progress her toward functional goals.    PT Frequency  2x / week    PT Duration  2 weeks    PT Treatment/Interventions  Aquatic Therapy;Electrical Stimulation;Moist Heat;Traction;Ultrasound;Therapeutic activities;Therapeutic exercise;Neuromuscular re-education;Manual techniques;Passive range of motion;Dry needling;Spinal Manipulations;Joint Manipulations;Taping    PT Next Visit Plan  continue gait training to reduce hip drop, foot intrinsic strengthening    PT Home Exercise Plan  Access Code: [email or web address], verbally added hip adduction with ball, tub exercises., physioball exercises    Consulted and Agree with Plan of Care  Patient       Patient will benefit from skilled therapeutic intervention in order to improve the following deficits and impairments:  Decreased activity tolerance, Decreased endurance, Decreased range of motion, Decreased strength, Difficulty walking, Postural dysfunction, Pain, Increased muscle spasms  Visit Diagnosis: Chronic bilateral low back pain with bilateral sciatica  Muscle weakness (generalized)     Problem List Patient Active Problem List   Diagnosis Date Noted  . MDD (major depressive disorder), recurrent episode, moderate ([place]) 04/17/2017    Beaux Wedemeyer C. Niki Cosman PT, DPT 03/17/19 4:07 PM   [facility] [address] [place], [place], [address] Phone: [phone]   Fax:  [phone]  Name: Chelsea Cherry MRN: [id number] Date of Birth: 12-02-1970

## 2019-03-30 ENCOUNTER — Encounter: Payer: Self-pay | Admitting: Physical Therapy

## 2019-03-30 ENCOUNTER — Ambulatory Visit: Payer: Medicaid Other | Admitting: Physical Therapy

## 2019-03-30 ENCOUNTER — Other Ambulatory Visit: Payer: Self-pay

## 2019-03-30 DIAGNOSIS — M6281 Muscle weakness (generalized): Secondary | ICD-10-CM

## 2019-03-30 DIAGNOSIS — G8929 Other chronic pain: Secondary | ICD-10-CM

## 2019-03-30 DIAGNOSIS — M5442 Lumbago with sciatica, left side: Secondary | ICD-10-CM

## 2019-03-30 NOTE — Addendum Note (Signed)
Addended by: Tondalaya Perren E on: 03/30/2019 10:05 AM   Modules accepted: Orders

## 2019-03-30 NOTE — Therapy (Signed)
[place], [place], [address] Phone: [phone]   Fax:  [phone]  Physical Therapy Treatment  Patient Details  Name: Chelsea Cherry MRN: [id number] Date of Birth: 1970-12-30 Referring Provider (PT): Simona Huh, NP   Encounter Date: 03/30/2019  PT End of Session - 03/30/19 0921    Visit Number  16    Number of Visits  20    Date for PT Re-Evaluation  04/06/19    Authorization Type  MCD 4 more visits through the end of year 12/17-12/30    Authorization - Visit Number  1    Authorization - Number of Visits  4    PT Start Time  0921    PT Stop Time  1007   ice at end   PT Time Calculation (min)  46 min    Activity Tolerance  Patient tolerated treatment well    Behavior During Therapy  [facility] for tasks assessed/performed       Past Medical History:  Diagnosis Date  . Anxiety   . Chronic headaches   . Colon polyps   . Depression   . Fibromyalgia   . Hyperlipidemia   . IBS (irritable bowel syndrome)   . Mitral valve prolapse   . SVT (supraventricular tachycardia) ([place])   . UTI (urinary tract infection)   . Vitamin D deficiency     Past Surgical History:  Procedure Laterality Date  . abdominal laproscopic surgery    . BREAST BIOPSY Right   . BREAST EXCISIONAL BIOPSY Right    x3  . BREAST EXCISIONAL BIOPSY Left   . FOOT SURGERY    . TUBAL LIGATION    . WISDOM TOOTH EXTRACTION      There were no vitals filed for this visit.  Subjective Assessment - 03/30/19 0923    Subjective  Pt had an injection in the Lt hip bursa sore from that today. Pt reports she has not been doing her HEP because she is to tired after working all day. Has questions about the hip hike and FWD lean T exercise.  Lt hip MRI scheduled 04/09/2019    Patient Stated Goals  get the pain more managed                       [facility] Adult PT Treatment/Exercise - 03/30/19 0001      Lumbar Exercises:  Stretches   Lower Trunk Rotation  2 reps;30 seconds   with legs crossed at knees     Lumbar Exercises: Aerobic   Nustep  10 min UE & LE L6      Lumbar Exercises: Standing   Other Standing Lumbar Exercises  reviewed recommended walking to decrease trendelenberg, recommend pt place hands on iliac crests so she can feel for the movment.  When doing this she was able to have improved knee/ankle motion during swing.       Lumbar Exercises: Seated   Other Seated Lumbar Exercises  10x5sec holds bilat toe crunches    Other Seated Lumbar Exercises  10 reps heel raises      Lumbar Exercises: Supine   Clam  10 reps   single leg with TA contraction    Clam Limitations  using hands to feel for stable pelvis    Single Leg Bridge  --   figure 4      Modalities   Modalities  Cryotherapy      Cryotherapy   Number Minutes Cryotherapy  10 Minutes    Cryotherapy Location  Hip   lt   Type of Cryotherapy  Ice pack               PT Short Term Goals - 02/02/19 1229      PT SHORT TERM GOAL #1   Title  Pt will be I and compliant with HEP 4 weeks 12/03/18    Baseline  pt reporting she has been doing all her stretches issued at her last visit    Period  Weeks    Status  Achieved      PT SHORT TERM GOAL #2   Title  Pt will report 2 ways of managing her pain at home other than with pain meds    Baseline  pt tries other ways, but ends up having to take pain meds to get any relief    Period  Weeks    Status  On-going        PT Long Term Goals - 03/17/19 1450      PT LONG TERM GOAL #1   Title  Pt will reduce pain overall 50% (target for all LTG 12 weeks 02/07/2019)    Baseline  avg 6/10 with ADLs, Average was 8/10    Status  On-going      PT LONG TERM GOAL #2   Title  Pt will improve ROM to [facility] to improve mobility.    Baseline  50% flexion, ext and rotation    Status  Achieved      PT LONG TERM GOAL #3   Title  Pt will improve strength to at least 5-/5 MMT to improve function     Baseline  5/5 MMT    Status  Achieved      PT LONG TERM GOAL #4   Title  Pt will improve overall activity tolerance with standing and walking to at least 30 minutes for ADL's    Baseline  average ability of 10 min on a walk    Status  On-going      PT LONG TERM GOAL #5   Title  Able to ambulate without trendelenburg    Baseline  Lt + Trendelenburg    Status  New            Plan - 03/30/19 0925    Clinical Impression Statement  Importance of pt completing her HEP stressed to her today, She stated she wasn't sure she was doing all of them correctly.  She verbalized feeling more confident with them after review today.    Rehab Potential  Good    PT Frequency  2x / week    PT Duration  2 weeks    PT Treatment/Interventions  Aquatic Therapy;Electrical Stimulation;Moist Heat;Traction;Ultrasound;Therapeutic activities;Therapeutic exercise;Neuromuscular re-education;Manual techniques;Passive range of motion;Dry needling;Spinal Manipulations;Joint Manipulations;Taping    PT Next Visit Plan  continue gait training to reduce hip drop, foot intrinsic strengthening    Consulted and Agree with Plan of Care  Patient       Patient will benefit from skilled therapeutic intervention in order to improve the following deficits and impairments:  Decreased activity tolerance, Decreased endurance, Decreased range of motion, Decreased strength, Difficulty walking, Postural dysfunction, Pain, Increased muscle spasms  Visit Diagnosis: Chronic bilateral low back pain with bilateral sciatica  Muscle weakness (generalized)     Problem List Patient Active Problem List   Diagnosis Date Noted  . MDD (major depressive disorder), recurrent episode, moderate ([place]) 04/17/2017    Jeral Pinch PT  03/30/2019, 9:57 AM  [place], [place], [address] Phone: [phone]   Fax:  [phone]  Name: Chelsea Cherry MRN:  [id number] Date of Birth: May 12, 1970

## 2019-04-04 ENCOUNTER — Encounter (HOSPITAL_COMMUNITY): Payer: Self-pay | Admitting: Registered Nurse

## 2019-04-04 ENCOUNTER — Emergency Department (HOSPITAL_COMMUNITY)
Admission: EM | Admit: 2019-04-04 | Discharge: 2019-04-04 | Disposition: A | Discharge status: Other Acute Inpatient Hospital | Payer: Medicaid Other | Attending: Emergency Medicine | Admitting: Emergency Medicine

## 2019-04-04 ENCOUNTER — Inpatient Hospital Stay (HOSPITAL_COMMUNITY)
Admission: AD | Admit: 2019-04-04 | Discharge: 2019-04-05 | DRG: 885 | Disposition: A | Discharge status: Home / Self Care | Payer: Medicaid Other | Source: Intra-hospital | Attending: Psychiatry | Admitting: Psychiatry

## 2019-04-04 ENCOUNTER — Other Ambulatory Visit: Payer: Self-pay

## 2019-04-04 ENCOUNTER — Ambulatory Visit: Payer: Medicaid Other | Admitting: Physical Therapy

## 2019-04-04 DIAGNOSIS — R45851 Suicidal ideations: Secondary | ICD-10-CM | POA: Insufficient documentation

## 2019-04-04 DIAGNOSIS — F332 Major depressive disorder, recurrent severe without psychotic features: Secondary | ICD-10-CM | POA: Diagnosis present

## 2019-04-04 DIAGNOSIS — Z7982 Long term (current) use of aspirin: Secondary | ICD-10-CM | POA: Insufficient documentation

## 2019-04-04 DIAGNOSIS — Z8249 Family history of ischemic heart disease and other diseases of the circulatory system: Secondary | ICD-10-CM

## 2019-04-04 DIAGNOSIS — Z20828 Contact with and (suspected) exposure to other viral communicable diseases: Secondary | ICD-10-CM | POA: Insufficient documentation

## 2019-04-04 DIAGNOSIS — Z818 Family history of other mental and behavioral disorders: Secondary | ICD-10-CM | POA: Diagnosis not present

## 2019-04-04 DIAGNOSIS — Z7984 Long term (current) use of oral hypoglycemic drugs: Secondary | ICD-10-CM | POA: Insufficient documentation

## 2019-04-04 DIAGNOSIS — F1210 Cannabis abuse, uncomplicated: Secondary | ICD-10-CM | POA: Diagnosis present

## 2019-04-04 DIAGNOSIS — M797 Fibromyalgia: Secondary | ICD-10-CM | POA: Diagnosis present

## 2019-04-04 DIAGNOSIS — E785 Hyperlipidemia, unspecified: Secondary | ICD-10-CM | POA: Diagnosis present

## 2019-04-04 DIAGNOSIS — Z79899 Other long term (current) drug therapy: Secondary | ICD-10-CM | POA: Insufficient documentation

## 2019-04-04 DIAGNOSIS — Z87891 Personal history of nicotine dependence: Secondary | ICD-10-CM | POA: Insufficient documentation

## 2019-04-04 DIAGNOSIS — Z791 Long term (current) use of non-steroidal anti-inflammatories (NSAID): Secondary | ICD-10-CM

## 2019-04-04 DIAGNOSIS — Z79890 Hormone replacement therapy (postmenopausal): Secondary | ICD-10-CM | POA: Diagnosis not present

## 2019-04-04 DIAGNOSIS — F3181 Bipolar II disorder: Secondary | ICD-10-CM

## 2019-04-04 LAB — COMPREHENSIVE METABOLIC PANEL
ALT: 15 U/L (ref 0–44)
AST: 15 U/L (ref 15–41)
Albumin: 4.2 g/dL (ref 3.5–5.0)
Alkaline Phosphatase: 83 U/L (ref 38–126)
Anion gap: 9 (ref 5–15)
BUN: 18 mg/dL (ref 6–20)
CO2: 26 mmol/L (ref 22–32)
Calcium: 9.3 mg/dL (ref 8.9–10.3)
Chloride: 102 mmol/L (ref 98–111)
Creatinine, Ser: 0.99 mg/dL (ref 0.44–1.00)
GFR calc Af Amer: >60 mL/min (ref >60)
GFR calc non Af Amer: >60 mL/min (ref >60)
Glucose, Bld: 89 mg/dL (ref 70–99)
Potassium: 3.9 mmol/L (ref 3.5–5.1)
Sodium: 137 mmol/L (ref 135–145)
Total Bilirubin: 0.4 mg/dL (ref 0.3–1.2)
Total Protein: 7.6 g/dL (ref 6.5–8.1)

## 2019-04-04 LAB — LIPID PANEL
Cholesterol: 203 mg/dL — ABNORMAL HIGH (ref 0–200)
HDL: 75 mg/dL (ref >40)
LDL Cholesterol: 85 mg/dL (ref 0–99)
Total CHOL/HDL Ratio: 2.7 RATIO
Triglycerides: 213 mg/dL — ABNORMAL HIGH (ref <150)
VLDL: 43 mg/dL — ABNORMAL HIGH (ref 0–40)

## 2019-04-04 LAB — ACETAMINOPHEN LEVEL: Acetaminophen (Tylenol), Serum: <10 ug/mL — ABNORMAL LOW (ref 10–30)

## 2019-04-04 LAB — CBC
HCT: 44.3 % (ref 36.0–46.0)
Hemoglobin: 14.7 g/dL (ref 12.0–15.0)
MCH: 29.2 pg (ref 26.0–34.0)
MCHC: 33.2 g/dL (ref 30.0–36.0)
MCV: 88.1 fL (ref 80.0–100.0)
Platelets: 262 10*3/uL (ref 150–400)
RBC: 5.03 MIL/uL (ref 3.87–5.11)
RDW: 12.5 % (ref 11.5–15.5)
WBC: 10.5 10*3/uL (ref 4.0–10.5)
nRBC: 0 % (ref 0.0–0.2)

## 2019-04-04 LAB — ETHANOL: Alcohol, Ethyl (B): <10 mg/dL (ref <10)

## 2019-04-04 LAB — RAPID URINE DRUG SCREEN, HOSP PERFORMED
Amphetamines: NOT DETECTED
Barbiturates: NOT DETECTED
Benzodiazepines: NOT DETECTED
Cocaine: NOT DETECTED
Opiates: POSITIVE — AB
Tetrahydrocannabinol: POSITIVE — AB

## 2019-04-04 LAB — RESPIRATORY PANEL BY RT PCR (FLU A&B, COVID)
Influenza A by PCR: NEGATIVE
Influenza B by PCR: NEGATIVE
SARS Coronavirus 2 by RT PCR: NEGATIVE

## 2019-04-04 LAB — GLUCOSE, CAPILLARY: Glucose-Capillary: 116 mg/dL — ABNORMAL HIGH (ref 70–99)

## 2019-04-04 LAB — TSH: TSH: 0.508 u[IU]/mL (ref 0.350–4.500)

## 2019-04-04 LAB — I-STAT BETA HCG BLOOD, ED (MC, WL, AP ONLY): I-stat hCG, quantitative: <5 m[IU]/mL (ref <5)

## 2019-04-04 LAB — HEMOGLOBIN A1C
Hgb A1c MFr Bld: 5.4 % (ref 4.8–5.6)
Mean Plasma Glucose: 108.28 mg/dL

## 2019-04-04 LAB — SALICYLATE LEVEL: Salicylate Lvl: <7 mg/dL — ABNORMAL LOW (ref 7.0–30.0)

## 2019-04-04 MED ORDER — FERROUS SULFATE 325 (65 FE) MG PO TABS: 325.0000 mg | ORAL_TABLET | ORAL | Status: DC

## 2019-04-04 MED ORDER — TRAZODONE HCL 50 MG PO TABS
50.0000 mg | ORAL_TABLET | Freq: Every day | ORAL | Status: DC
  Administered 2019-04-04: 22:00:00 50 mg via ORAL
  Filled 2019-04-04 (×3): qty 1

## 2019-04-04 MED ORDER — CELECOXIB 200 MG PO CAPS
200.0000 mg | ORAL_CAPSULE | Freq: Every day | ORAL | Status: DC
  Administered 2019-04-05: 200 mg via ORAL
  Filled 2019-04-04 (×2): qty 1

## 2019-04-04 MED ORDER — PANTOPRAZOLE SODIUM 40 MG PO TBEC
40.0000 mg | DELAYED_RELEASE_TABLET | Freq: Every day | ORAL | Status: DC
  Administered 2019-04-05: 10:00:00 40 mg via ORAL
  Filled 2019-04-04 (×2): qty 1

## 2019-04-04 MED ORDER — MIRABEGRON ER 50 MG PO TB24
50.0000 mg | ORAL_TABLET | Freq: Every day | ORAL | Status: DC
  Administered 2019-04-05: 50 mg via ORAL
  Filled 2019-04-04 (×2): qty 1

## 2019-04-04 MED ORDER — LEVOTHYROXINE SODIUM 50 MCG PO TABS
50.0000 ug | ORAL_TABLET | Freq: Every day | ORAL | Status: DC
  Administered 2019-04-05: 07:00:00 50 ug via ORAL
  Filled 2019-04-04 (×2): qty 1
  Filled 2019-04-04: qty 2

## 2019-04-04 MED ORDER — METFORMIN HCL 500 MG PO TABS
1000.0000 mg | ORAL_TABLET | Freq: Every day | ORAL | Status: DC
  Administered 2019-04-04: 1000 mg via ORAL
  Filled 2019-04-04 (×3): qty 2

## 2019-04-04 MED ORDER — GABAPENTIN 300 MG PO CAPS
300.0000 mg | ORAL_CAPSULE | Freq: Two times a day (BID) | ORAL | Status: DC
  Administered 2019-04-04 – 2019-04-05 (×2): 300 mg via ORAL
  Filled 2019-04-04 (×5): qty 1

## 2019-04-04 MED ORDER — PRAVASTATIN SODIUM 80 MG PO TABS
80.0000 mg | ORAL_TABLET | Freq: Every day | ORAL | Status: DC
  Filled 2019-04-04 (×2): qty 1

## 2019-04-04 MED ORDER — DULOXETINE HCL 60 MG PO CPEP
120.0000 mg | ORAL_CAPSULE | Freq: Every day | ORAL | Status: DC
  Filled 2019-04-04: qty 2

## 2019-04-04 MED ORDER — PRAVASTATIN SODIUM 40 MG PO TABS
80.0000 mg | ORAL_TABLET | Freq: Every day | ORAL | Status: DC
  Administered 2019-04-04: 80 mg via ORAL
  Filled 2019-04-04 (×2): qty 2

## 2019-04-04 MED ORDER — DULOXETINE HCL 60 MG PO CPEP
120.0000 mg | ORAL_CAPSULE | Freq: Every day | ORAL | Status: DC
  Administered 2019-04-04: 120 mg via ORAL
  Filled 2019-04-04 (×3): qty 2

## 2019-04-04 MED ORDER — TRAZODONE HCL 50 MG PO TABS
50.0000 mg | ORAL_TABLET | Freq: Once | ORAL | Status: AC
  Administered 2019-04-05: 50 mg via ORAL
  Filled 2019-04-04 (×2): qty 1

## 2019-04-04 MED ORDER — ASPIRIN 81 MG PO TBEC
81.0000 mg | DELAYED_RELEASE_TABLET | Freq: Every day | ORAL | Status: DC
  Administered 2019-04-05: 81 mg via ORAL
  Filled 2019-04-04 (×2): qty 1

## 2019-04-04 MED ORDER — ICOSAPENT ETHYL 1 G PO CAPS
1.0000 g | ORAL_CAPSULE | Freq: Two times a day (BID) | ORAL | Status: DC
  Administered 2019-04-04 – 2019-04-05 (×2): 1 g via ORAL
  Filled 2019-04-04 (×4): qty 1

## 2019-04-04 MED ORDER — HYDROCODONE-ACETAMINOPHEN 5-325 MG PO TABS
1.0000 | ORAL_TABLET | Freq: Four times a day (QID) | ORAL | Status: DC | PRN
  Administered 2019-04-04: 22:00:00 1 via ORAL
  Filled 2019-04-04: qty 1

## 2019-04-04 MED ORDER — METOPROLOL SUCCINATE ER 50 MG PO TB24
75.0000 mg | ORAL_TABLET | Freq: Every day | ORAL | Status: DC
  Administered 2019-04-05: 10:00:00 75 mg via ORAL
  Filled 2019-04-04 (×2): qty 1

## 2019-04-04 NOTE — ED Notes (Signed)
Behavioral Health called to say pt accepted to 307 bed 2 by [facility], NP. Attending Dr. Parke Poisson. Call report [phone]. BH is ready to accept pt now.

## 2019-04-04 NOTE — ED Notes (Signed)
Daughter of the patient called, Osie Cheeks, said she is a Marine scientist, and she would like updates on her mother as well.

## 2019-04-04 NOTE — ED Notes (Signed)
Talked to [facility]. They requested pt lab work be completed prior to transfer. Called lab. Lab said they will add on HA1c, lipid, TSH. Full rainbow blood draw at lab.

## 2019-04-04 NOTE — ED Notes (Signed)
Called [facility] to transport patient over to 307, bed 2 at [facility].

## 2019-04-04 NOTE — ED Notes (Signed)
[facility] is here and patient ambulating with her belongings and staff to the front ED entrance.

## 2019-04-04 NOTE — ED Triage Notes (Signed)
Chelsea Cherry transported pt from [facility] to [facility] ED and reports the following:  Pt typically sees Stanton Kidney at [facility] every 3 months. Pt went there to see Stanton Kidney, but Stanton Kidney was not there. The past couple days she's been having constant suicidal thoughts. She cant function because the thoughts are so persistant and she wants it to stop. Pt wants support and is trying to get help. Her partner [name] [phone] (cell) and daughter Treasa School are in the waiting room.

## 2019-04-04 NOTE — Discharge Summary (Addendum)
Patient is to be transferred to [place] for inpatient psychiatric treatment  Attest to NP Note

## 2019-04-04 NOTE — Tx Team (Signed)
Initial Treatment Plan 04/04/2019 11:42 PM Chelsea Cherry [id number]    PATIENT STRESSORS: Health problems Substance abuse   PATIENT STRENGTHS: Capable of independent living Supportive family/friends Work skills   PATIENT IDENTIFIED PROBLEMS: Anxiety  Depression  "Coping skills"  "Med adjustment"               DISCHARGE CRITERIA:  Ability to meet basic life and health needs Improved stabilization in mood, thinking, and/or behavior Medical problems require only outpatient monitoring Motivation to continue treatment in a less acute level of care  PRELIMINARY DISCHARGE PLAN: Attend aftercare/continuing care group Attend PHP/IOP Outpatient therapy Return to previous living arrangement Return to previous work or school arrangements  PATIENT/FAMILY INVOLVEMENT: This treatment plan has been presented to and reviewed with the patient, Chelsea Cherry, and/or family member.  The patient and family have been given the opportunity to ask questions and make suggestions.  Wolfgang Phoenix, RN 04/04/2019, 11:42 PM

## 2019-04-04 NOTE — ED Notes (Signed)
Son of this patient Chelsea Cherry would like a phone call with an update when available, [phone].

## 2019-04-04 NOTE — BH Assessment (Signed)
Pt gave verbal authorization to speak with children and significant other, Justin. Phone call to [name] to update on disposition recommendation. He states he feels better that pt is recommended for inpt tx.

## 2019-04-04 NOTE — BH Assessment (Signed)
Patient accepted to [facility] adult unit room 307-2 pending negative COVID results. The accepting provider is [name], NP. The attending provider is Dr. Parke Poisson. Patient's nurse Joellen Jersey) @ [facility] was notified of patient's disposition.

## 2019-04-04 NOTE — ED Provider Notes (Signed)
[facility] DEPT Provider Note   CSN: [id number] Arrival date & time: 04/04/19  1635     History Chief Complaint  Patient presents with  . Suicidal    Chelsea Cherry is a 48 y.o. female with history of anxiety, depression, fibromyalgia, hyperlipidemia, IBS, mitral valve prolapse resents for evaluation of acute onset, progressively worsening suicidal thoughts for the last 1 week or so.  Reports very intrusive thoughts and "visions "of harming herself, most recently thought of slitting her wrists and sitting in a bathtub.  She reports that she typically is not very depressed but when she becomes stressed the suicidal ideations start and she cannot seem to make them stop.  She denies homicidal ideations or auditory or visual hallucinations.  She denies any fevers, chest pain, shortness of breath, abdominal pain, nausea, vomiting or known sick contacts.  She went to her PCP and her psychiatrist at [facility] but they were unavailable so she came here because she states "I just could not go home, I am afraid that 1 day I will act on these thoughts".  She notes numerous stressors between family and finances.  The history is provided by the patient.       Past Medical History:  Diagnosis Date  . Anxiety   . Chronic headaches   . Colon polyps   . Depression   . Fibromyalgia   . Hyperlipidemia   . IBS (irritable bowel syndrome)   . Mitral valve prolapse   . SVT (supraventricular tachycardia) ([place])   . UTI (urinary tract infection)   . Vitamin D deficiency     Patient Active Problem List   Diagnosis Date Noted  . MDD (major depressive disorder), recurrent severe, without psychosis ([place]) 04/04/2019  . MDD (major depressive disorder), recurrent episode, moderate ([place]) 04/17/2017    Past Surgical History:  Procedure Laterality Date  . abdominal laproscopic surgery    . BREAST BIOPSY Right   . BREAST EXCISIONAL BIOPSY Right    x3  .  BREAST EXCISIONAL BIOPSY Left   . FOOT SURGERY    . TUBAL LIGATION    . WISDOM TOOTH EXTRACTION       OB History   No obstetric history on file.     Family History  Problem Relation Age of Onset  . Fibromyalgia Mother   . Depression Mother   . Anxiety disorder Mother   . Heart disease Mother   . Alcohol abuse Maternal Uncle   . Alcohol abuse Maternal Grandmother   . Ovarian cancer Maternal Grandmother   . Bladder Cancer Maternal Grandmother     Social History   Tobacco Use  . Smoking status: Former Smoker    Quit date: 01/03/2007    Years since quitting: 12.2  . Smokeless tobacco: Never Used  Substance Use Topics  . Alcohol use: Yes    Alcohol/week: 1.0 standard drinks    Types: 1 Standard drinks or equivalent per week    Comment: 3-4 week  . Drug use: Never    Home Medications Prior to Admission medications   Medication Sig Start Date End Date Taking? Authorizing Provider  aspirin EC 81 MG tablet Take 81 mg by mouth daily.   Yes [provider]  celecoxib (CELEBREX) 200 MG capsule Take 200 mg by mouth daily.   Yes [provider]  dexlansoprazole (DEXILANT) 60 MG capsule Take 60 mg by mouth daily.   Yes [provider]  DULoxetine (CYMBALTA) 60 MG capsule Take  2 capsules (120 mg total) by mouth daily. Patient taking differently: Take 120 mg by mouth at bedtime.  09/14/17  Yes Hisada, Elie Goody, MD  FEROSUL 325 (65 Fe) MG tablet Take 325 mg by mouth 2 (two) times a week.  03/17/19  Yes [provider]  gabapentin (NEURONTIN) 300 MG capsule Take 300-600 mg by mouth 2 (two) times daily. Take 300 mg in the morning and Take 600 mg by mouth at bedtime   Yes [provider]  glucosamine-chondroitin 500-400 MG tablet Take 1 tablet by mouth 2 (two) times daily.    Yes [provider]  HYDROcodone-acetaminophen (NORCO/VICODIN) 5-325 MG tablet Take 1 tablet by mouth every 6 (six) hours as needed for moderate pain.   Yes [provider]  levothyroxine (SYNTHROID, LEVOTHROID) 50 MCG tablet Take 50 mcg by mouth daily before breakfast.   Yes [provider]  loratadine (CLARITIN) 10 MG tablet Take 10 mg by mouth daily.   Yes [provider]  metFORMIN (GLUCOPHAGE) 500 MG tablet Take 1,000 mg by mouth every evening.    Yes [provider]  metoprolol succinate (TOPROL-XL) 50 MG 24 hr tablet Take 75 mg by mouth daily. 02/21/19  Yes [provider]  mirabegron ER (MYRBETRIQ) 50 MG TB24 tablet Take 50 mg by mouth daily.   Yes [provider]  NARCAN 4 MG/0.1ML LIQD nasal spray kit Place 1 spray into the nose once.  07/09/18  Yes [provider]  pravastatin (PRAVACHOL) 80 MG tablet Take 80 mg by mouth at bedtime. 01/24/19  Yes [provider]  SUMAtriptan (IMITREX) 50 MG tablet Take 50 mg by mouth every 2 (two) hours as needed for migraine. May repeat in 2 hours if headache persists or recurs.   Yes [provider]  traZODone (DESYREL) 50 MG tablet Take 50 mg by mouth at bedtime.    Yes [provider]  UNABLE TO FIND Take 2 tablets by mouth daily. Med Name: tumeric 2 daily    Yes [provider]  VASCEPA 0.5 g CAPS Take 2 capsules by mouth 2 (two) times daily. 03/18/19  Yes [provider]    Allergies    Patient has no known allergies.  Review of Systems   Review of Systems  Constitutional: Negative for chills and fever.  Respiratory: Negative for cough and shortness of breath.   Cardiovascular: Negative for chest pain.  Psychiatric/Behavioral: Positive for dysphoric mood and suicidal ideas. Negative for hallucinations. The patient is nervous/anxious.   All other systems reviewed and are negative.   Physical Exam Updated Vital Signs BP 119/76 (BP Location: Right Arm)   Pulse 60   Temp 98.5 F (36.9 C) (Oral)   Resp 16   Ht '5\' 7"'$  (1.702 m)   Wt 88 kg   SpO2 99%   BMI 30.38 kg/m   Physical Exam Vitals and  nursing note reviewed.  Constitutional:      General: She is not in acute distress.    Appearance: She is well-developed.  HENT:     Head: Normocephalic and atraumatic.  Eyes:     General:        Right eye: No discharge.        Left eye: No discharge.     Conjunctiva/sclera: Conjunctivae normal.  Neck:     Vascular: No JVD.     Trachea: No tracheal deviation.  Cardiovascular:     Rate and Rhythm: Normal rate and regular rhythm.  Pulses: Normal pulses.     Heart sounds: Normal heart sounds.  Pulmonary:     Effort: Pulmonary effort is normal.     Breath sounds: Normal breath sounds.  Abdominal:     General: Abdomen is flat. Bowel sounds are normal. There is no distension.     Palpations: Abdomen is soft.     Tenderness: There is no abdominal tenderness. There is no guarding or rebound.  Musculoskeletal:     Cervical back: Neck supple.  Skin:    Findings: No erythema.  Neurological:     Mental Status: She is alert.  Psychiatric:        Mood and Affect: Affect is tearful.        Speech: Speech normal.        Behavior: Behavior normal. Behavior is cooperative.        Thought Content: Thought content includes suicidal ideation. Thought content does not include homicidal ideation. Thought content includes suicidal plan. Thought content does not include homicidal plan.     Comments: Does not appear to be responding to internal stimuli at this time     ED Results / Procedures / Treatments   Labs (all labs ordered are listed, but only abnormal results are displayed) Labs Reviewed  SALICYLATE LEVEL - Abnormal; Notable for the following components:      Result Value   Salicylate Lvl <2.9 (*)    All other components within normal limits  ACETAMINOPHEN LEVEL - Abnormal; Notable for the following components:   Acetaminophen (Tylenol), Serum <10 (*)    All other components within normal limits  RAPID URINE DRUG SCREEN, HOSP PERFORMED - Abnormal; Notable for the following  components:   Opiates POSITIVE (*)    Tetrahydrocannabinol POSITIVE (*)    All other components within normal limits  RESPIRATORY PANEL BY RT PCR (FLU A&B, COVID)  COMPREHENSIVE METABOLIC PANEL  ETHANOL  CBC  HEMOGLOBIN A1C  LIPID PANEL  TSH  I-STAT BETA HCG BLOOD, ED ([facility], [facility], [facility] ONLY)  I-STAT BETA HCG BLOOD, ED ([facility], [facility], [facility] ONLY)    EKG None  Radiology No results found.  Procedures Procedures (including critical care time)  Medications Ordered in ED Medications - No data to display  ED Course  I have reviewed the triage vital signs and the nursing notes.  Pertinent labs & imaging results that were available during my care of the patient were reviewed by me and considered in my medical decision making (see chart for details).    MDM Rules/Calculators/A&P                      Patient presenting for evaluation of progressively worsening intrusive thoughts and suicidal ideations.  She is afebrile, vital signs are stable.  She is nontoxic in appearance.  She is clearly distressed from an emotional standpoint but physical examination is reassuring.  Screening labs reviewed by me show no leukocytosis, no anemia, no metabolic derangements, no renal insufficiency.  UDS is positive for opiates and THC.  She is medically cleared for TTS evaluation at this time.  TTS recommends inpatient admission.  She is here voluntarily and may require IVC if she attempts to leave prior to transfer to behavioral health Hospital. Final Clinical Impression(s) / ED Diagnoses Final diagnoses:  Suicidal ideation    Rx / DC Orders ED Discharge Orders    None       Debroah Baller 04/04/19 2155    Lucrezia Starch, MD  04/06/19 1603  
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
## 2019-04-04 NOTE — ED Notes (Signed)
Partner [name] called and said he has know pt for 6 years and her SI just started 1-1.5 weeks ago. These are new thoughts for her. She has been depressed about family, feeling overwhelmed, and a lot of these feelings are related to finances that "crumbled." Daughter's name is Aldona Bar [phone] and son Radonna Ricker [phone].

## 2019-04-04 NOTE — Progress Notes (Signed)
Chelsea Cherry is a 48 y.o. female Voluntary admitted for increased anxiety and depression. Pt stated she has being going through episode of being stressed almost feeling like panic attacks to having visions of dying. Pt stated she called her PCP but she was not able to be seen, pt then went to the ED.  Pt has been calm and cooperative with admission process, alert and oriented x 4. Denies SI/HI, AVH and verbally contracted for safety. Consents signed, skin/belongings search completed and pt oriented to unit. Pt stable at this time. Pt given the opportunity to express concerns and ask questions. Pt given toiletries. Will continue to monitor.

## 2019-04-05 DIAGNOSIS — F3181 Bipolar II disorder: Secondary | ICD-10-CM

## 2019-04-05 LAB — GLUCOSE, CAPILLARY: Glucose-Capillary: 99 mg/dL (ref 70–99)

## 2019-04-05 NOTE — Progress Notes (Signed)
  [facility] Adult Case Management Discharge Plan :  Will you be returning to the same living situation after discharge:  Yes,  home. At discharge, do you have transportation home?: Yes,  fiance picking up. Do you have the ability to pay for your medications: Yes,  Medicaid.  Release of information consent forms completed and in the chart; letter on the chart along with SPE pamphlet.  Patient to Follow up at: Follow-up Information    [place]. Go on 04/12/2019.   Why: Your next primary care appointment is on 04/12/2019 at 10:15am with [facility]. Your next psychiatry appointment is 04/27/19 at 10:00am with Dr. [name]. Be sure to provide any discharge paperwork. Please contact office for any additional questions.  Contact information: [address] [place], [place] [address]  Ph: [phone] Fax: [phone]          Next level of care provider has access to [place] and Suicide Prevention discussed: Yes,  with patient.  Have you used any form of tobacco in the last 30 days? (Cigarettes, Smokeless Tobacco, Cigars, and/or Pipes): No  Has patient been referred to the Quitline?: N/A patient is not a smoker  Patient has been referred for addiction treatment: Yes  Joellen Jersey, [place] 04/05/2019, 9:56 AM

## 2019-04-05 NOTE — Discharge Summary (Signed)
Patient same day admission discharge. See HPI. Discharge at this time.

## 2019-04-05 NOTE — Progress Notes (Signed)
Discharge note:  D:  Pt verbalized readiness for discharge and denied SI/HI/AVH.   A: Discharge instructions reviewed with patient and pt's belongings were returned to patient.   R: Pt verbalized understanding of d/c instructions and she did not have any questions or concerns. Pt said that she was thankful for the care she received at [facility].
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
## 2019-04-05 NOTE — BHH Suicide Risk Assessment (Signed)
[facility] INPATIENT:  Family/Significant Other Suicide Prevention Education  Suicide Prevention Education:  Patient Refusal for Family/Significant Other Suicide Prevention Education: The patient Chelsea Cherry has refused to provide written consent for family/significant other to be provided Family/Significant Other Suicide Prevention Education during admission and/or prior to discharge.  Physician notified.   SPE provided to patient.    Joellen Jersey 04/05/2019, 9:56 AM

## 2019-04-05 NOTE — BHH Counselor (Signed)
Adult Comprehensive Assessment  Patient ID: Chelsea Cherry, female   DOB: [birth year], 48 y.o.   MRN: [id number]    Patient was admitted to the hospital on 04/04/19 and was cleared for discharge within 24 hours of admission. A full psycho-social assessment was not able to be completed. CSW met with the patient to determine if the patient was agreeable to any outpatient follow up or referral. Patient was agreeable to continue follow up with [facility] for primary care, outpatient medication management and therapy services.   Summary/Recommendations:   Summary and Recommendations (to be completed by the evaluator): Patient was admitted to the hospital on 04/04/19 and was cleared for discharge within 24 hours of admission. A complete psycho-social assessment was not completed. Chelsea Cherry is a 47 year old female who is diagnosed MDD (major depressive disorder), recurrent severe, without psychosis. She presented to the hospital seeking treatment for suicidal ideation, anxiety, depression and impulsivity. Chelsea Cherry was agreeable to continue to follow up with her providers at [facility] for primary care and psychiatric medication management. Chelsea Cherry benefit from crisis stabilization, medication management, therapeutic milieu, group therapy services, psycho-education and referral services.  Chelsea Cherry. 04/05/2019
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
## 2019-04-05 NOTE — [facility] Suicide Risk Assessment (Signed)
[facility] Discharge Suicide Risk Assessment   Principal Problem: <principal problem not specified> Discharge Diagnoses: Active Problems:   MDD (major depressive disorder), recurrent severe, without psychosis ([place])   Total Time spent with patient: 30 minutes  Musculoskeletal: Strength & Muscle Tone: within normal limits Gait & Station: normal Patient leans: N/A  Psychiatric Specialty Exam: Review of Systems  Musculoskeletal: Positive for arthralgias, back pain and myalgias.  All other systems reviewed and are negative.   Blood pressure 100/77, pulse 78, temperature (!) 97.2 F (36.2 C), resp. rate 16, height 5\' 7"  (1.702 m), weight 88.4 kg, SpO2 99 %.Body mass index is 30.51 kg/m.  General Appearance: Casual  Eye Contact::  Good  Speech:  Normal Rate409  Volume:  Normal  Mood:  Anxious  Affect:  Congruent  Thought Process:  Coherent and Descriptions of Associations: Intact  Orientation:  Full (Time, Place, and Person)  Thought Content:  Logical  Suicidal Thoughts:  No  Homicidal Thoughts:  No  Memory:  Immediate;   Fair Recent;   Fair Remote;   Fair  Judgement:  Intact  Insight:  Fair  Psychomotor Activity:  Normal  Concentration:  Fair  Recall:  Good  Fund of Knowledge:Good  Language: Good  Akathisia:  Negative  Handed:  Right  AIMS (if indicated):     Assets:  Communication Skills Desire for Improvement Financial Resources/Insurance Housing Resilience Social Support Transportation  Sleep:  Number of Hours: 5  Cognition: WNL  ADL's:  Intact   Mental Status Per Nursing Assessment::   On Admission:  NA  Demographic Factors:  Divorced or widowed, Caucasian and Low socioeconomic status  Loss Factors: NA  Historical Factors: Impulsivity  Risk Reduction Factors:   Responsible for children under 21 years of age, Sense of responsibility to family, Employed, Living with another person, especially a relative and Positive therapeutic relationship  Continued  Clinical Symptoms:  Severe Anxiety and/or Agitation Depression:   Impulsivity  Cognitive Features That Contribute To Risk:  None    Suicide Risk:  Minimal: No identifiable suicidal ideation.  Patients presenting with no risk factors but with morbid ruminations; may be classified as minimal risk based on the severity of the depressive symptoms  Follow-up [place]. Go on 04/12/2019.   Why: Your next primary care appointment is on 04/12/2019 at 10:15am with [facility]. Your next psychiatry appointment is 04/27/19 at 10:00am with Dr. [name]. Be sure to provide any discharge paperwork. Please contact office for any additional questions.  Contact information: [address] [place], [place] [address]  Ph: [phone] Fax: [phone]          Plan Of Care/Follow-up recommendations:  Activity:  ad lib  Sharma Covert, MD 04/05/2019, 10:07 AM

## 2019-04-07 ENCOUNTER — Ambulatory Visit: Payer: Medicaid Other | Admitting: Physical Therapy

## 2019-04-07 ENCOUNTER — Encounter

## 2019-04-13 ENCOUNTER — Other Ambulatory Visit: Payer: Self-pay

## 2019-04-13 ENCOUNTER — Encounter: Payer: Self-pay | Admitting: Physical Therapy

## 2019-04-13 ENCOUNTER — Ambulatory Visit: Payer: Medicaid Other | Attending: Nurse Practitioner | Admitting: Physical Therapy

## 2019-04-13 DIAGNOSIS — M6281 Muscle weakness (generalized): Secondary | ICD-10-CM | POA: Diagnosis present

## 2019-04-13 DIAGNOSIS — M5441 Lumbago with sciatica, right side: Secondary | ICD-10-CM | POA: Diagnosis present

## 2019-04-13 DIAGNOSIS — G8929 Other chronic pain: Secondary | ICD-10-CM

## 2019-04-13 DIAGNOSIS — M25552 Pain in left hip: Secondary | ICD-10-CM | POA: Insufficient documentation

## 2019-04-13 DIAGNOSIS — M5442 Lumbago with sciatica, left side: Secondary | ICD-10-CM | POA: Diagnosis not present

## 2019-04-13 NOTE — Therapy (Signed)
[place], [place], [address] Phone: [phone]   Fax:  [phone]  Physical Therapy Treatment Re-submitted for Medicaid   Patient Details  Name: Chelsea Cherry MRN: [id number] Date of Birth: 1970-05-14 Referring Provider (PT): Simona Huh, NP   Encounter Date: 04/13/2019  PT End of Session - 04/13/19 1431    Visit Number  17    Number of Visits  20    Authorization Type  submitted for 12 additional visits on 04/13/2019    PT Start Time  1330    PT Stop Time  1417    PT Time Calculation (min)  47 min    Activity Tolerance  Patient tolerated treatment well    Behavior During Therapy  [facility] for tasks assessed/performed       Past Medical History:  Diagnosis Date  . Anxiety   . Chronic headaches   . Colon polyps   . Depression   . Fibromyalgia   . Hyperlipidemia   . IBS (irritable bowel syndrome)   . Mitral valve prolapse   . SVT (supraventricular tachycardia) ([place])   . UTI (urinary tract infection)   . Vitamin D deficiency     Past Surgical History:  Procedure Laterality Date  . abdominal laproscopic surgery    . BREAST BIOPSY Right   . BREAST EXCISIONAL BIOPSY Right    x3  . BREAST EXCISIONAL BIOPSY Left   . FOOT SURGERY    . TUBAL LIGATION    . WISDOM TOOTH EXTRACTION      There were no vitals filed for this visit.  Subjective Assessment - 04/13/19 1345    Subjective  Pt arriving to therapy today reporting no relief from Left hip injection. Pt reporting 10/10 pain in left hip last week for 3 days. Today pt reporting 3/10 pain in left hip. Pt reporting 7/10 pain in low back.    Pertinent History  [id number] headaches, fibromyalgia,    Limitations  Sitting;Walking;Lifting;Standing    How long can you sit comfortably?  depends    How long can you stand comfortably?  5-10  min    Diagnostic tests  Lumbar MRI 05/2018 "Trace anterolisthesis of L4 on L5 with associated advanced  facetarthropathy, with resultant mild bilateral lateral recess stenosis. Facet disease at this level could contribute to lower back pain. Additional DDD and facet hypertrophy at L3-4 and L5-S1 without stenosis.    Currently in Pain?  Yes    Pain Score  7     Pain Location  Back    Pain Orientation  Lower    Pain Descriptors / Indicators  Aching    Pain Type  Chronic pain    Pain Onset  More than a month ago    Pain Frequency  Intermittent    Aggravating Factors   walking long periords, standing too long    Pain Relieving Factors  pain meds    Effect of Pain on Daily Activities  limits ADL's    Multiple Pain Sites  Yes    Pain Score  3    Pain Location  Hip    Pain Orientation  Left    Pain Descriptors / Indicators  Aching;Dull    Pain Type  Chronic pain    Pain Onset  More than a month ago    Pain Frequency  Intermittent    Aggravating Factors   transitioning from sit to stand, standing long periods, bending    Pain Relieving Factors  changing positions         [facility] PT Assessment - 04/13/19 0001      Assessment   Medical Diagnosis  lumbar pain with radiculopathy    Referring Provider (PT)  Simona Huh, NP      Balance Screen   Has the patient fallen in the past 6 months  No      Prior Function   Level of Independence  Independent      Cognition   Overall Cognitive Status  Within Functional Limits for tasks assessed      Posture/Postural Control   Posture Comments  requires cues or hand placement on hips to maintain pelvic alignment to prevent trendelburg on left hip      Strength   Overall Strength Comments  grossly 5/5      Palpation   SI assessment   SIJ crepitus noted with sit to stand today                   [facility] Adult PT Treatment/Exercise - 04/13/19 0001      Lumbar Exercises: Stretches   Lower Trunk Rotation  2 reps;30 seconds   with legs crossed at knees     Lumbar Exercises: Aerobic   Nustep  10 min UE & LE L6      Lumbar Exercises:  Standing   Heel Raises  10 reps    Other Standing Lumbar Exercises  L hip lifting when R LE is standing on 2 inch step     Other Standing Lumbar Exercises  reviewed recommended walking to decrease trendelenberg, recommend pt place hands on iliac crests so she can feel for the movment. Pt was able to correct compensation with concentration on her pelvis      Lumbar Exercises: Seated   Other Seated Lumbar Exercises  10 reps heel raises      Lumbar Exercises: Supine   Clam  10 reps   single leg with TA contraction    Clam Limitations  Pt using hands to maintain pelvic alignment. Pt reporting more "stress" in her L hip, but no back pain with this exercise.     Single Leg Bridge  --   alternating with focus on pelvis alignment   Other Supine Lumbar Exercises  supine marching x 15 with cue to maintain core activation      Modalities   Modalities  --               PT Short Term Goals - 04/13/19 1351      PT SHORT TERM GOAL #1   Baseline  pt reporting she has been doing all her stretches issued at her last visit    Time  4    Period  Weeks    Status  Achieved      PT SHORT TERM GOAL #2   Title  Pt will report 2 ways of managing her pain at home other than with pain meds    Baseline  pt tries other ways, but ends up having to take pain meds to get any relief. Pt reported she feels better at times, but the pain comes back.    Time  4    Period  Weeks    Status  On-going        PT Long Term Goals - 04/13/19 1354      PT LONG TERM GOAL #1   Title  Pt will reduce pain overall 50% (target for all LTG 12 weeks 02/07/2019)  Baseline  avg 6/10 with ADLs, Average was 8/10    Time  8    Period  Weeks    Status  On-going      PT LONG TERM GOAL #2   Title  Pt will improve ROM to [facility] to improve mobility.    Baseline  50% flexion, ext and rotation    Time  8    Period  Weeks    Status  Achieved      PT LONG TERM GOAL #3   Title  Pt will improve strength to at least 5-/5 MMT  to improve function    Baseline  5/5 MMT    Time  8    Period  Weeks    Status  Achieved      PT LONG TERM GOAL #4   Title  Pt will improve overall activity tolerance with standing and walking to at least 30 minutes for ADL's    Baseline  Pt reporting she is able to stand/walk about 10 minutes before having to sit    Time  8    Period  Weeks    Status  On-going      PT LONG TERM GOAL #5   Title  Able to ambulate without trendelenburg    Baseline  Lt + Trendelenburg    Status  New            Plan - 04/13/19 1427    Clinical Impression Statement  Pt arriving to therapy today still reporting 3/10 pain in her left hip, pt s/p L hip injection. Pt also reporting 7/10 LBP. Pt feels like she has improved since beginning therapy but still requires use of her pain meds for functional ADL's and community activities. Pt has currently met 1/2 of her STG's and 2/5 of her LTG's set. Pt still progressing toward pelvis alignment during gait and progressing with functional LE endurance. Skilled PT needed to contiue to address pt's impairments with the below interventions.    Personal Factors and Comorbidities  Fitness;Comorbidity 1;Comorbidity 2    Comorbidities  [id number] headaches, fibromyalgia, depression    Examination-Participation Restrictions  Meal Prep;Community Activity;Laundry;Shop    Stability/Clinical Decision Making  Evolving/Moderate complexity    Rehab Potential  Good    PT Frequency  2x / week    PT Duration  6 weeks    PT Treatment/Interventions  Aquatic Therapy;Electrical Stimulation;Moist Heat;Traction;Ultrasound;Therapeutic activities;Therapeutic exercise;Neuromuscular re-education;Manual techniques;Passive range of motion;Dry needling;Spinal Manipulations;Joint Manipulations;Taping    PT Next Visit Plan  continue gait training to reduce hip drop, foot intrinsic strengthening    PT Home Exercise Plan  Access Code: [email or web address], verbally added hip adduction with ball, tub  exercises., physioball exercises    Consulted and Agree with Plan of Care  Patient       Patient will benefit from skilled therapeutic intervention in order to improve the following deficits and impairments:  Decreased activity tolerance, Decreased endurance, Decreased range of motion, Decreased strength, Difficulty walking, Postural dysfunction, Pain, Increased muscle spasms  Visit Diagnosis: Chronic bilateral low back pain with bilateral sciatica  Muscle weakness (generalized)     Problem List Patient Active Problem List   Diagnosis Date Noted  . Bipolar II disorder ([place])   . MDD (major depressive disorder), recurrent severe, without psychosis ([place]) 04/04/2019  . MDD (major depressive disorder), recurrent episode, moderate ([place]) 04/17/2017    Oretha Caprice, PT 04/13/2019, 2:34 PM  [facility] [address] [place], [place], [address]  Phone: [phone]   Fax:  [phone]  Name: Earl Zellmer MRN: [id number] Date of Birth: 15-Mar-1971

## 2019-04-20 ENCOUNTER — Ambulatory Visit: Payer: Medicaid Other | Admitting: Physical Therapy

## 2019-04-25 ENCOUNTER — Ambulatory Visit: Payer: Medicaid Other | Admitting: Physical Therapy

## 2019-04-25 ENCOUNTER — Other Ambulatory Visit: Payer: Self-pay

## 2019-04-25 ENCOUNTER — Encounter: Payer: Self-pay | Admitting: Physical Therapy

## 2019-04-25 DIAGNOSIS — M5442 Lumbago with sciatica, left side: Secondary | ICD-10-CM | POA: Diagnosis not present

## 2019-04-25 DIAGNOSIS — M6281 Muscle weakness (generalized): Secondary | ICD-10-CM

## 2019-04-25 DIAGNOSIS — G8929 Other chronic pain: Secondary | ICD-10-CM

## 2019-04-25 DIAGNOSIS — M25552 Pain in left hip: Secondary | ICD-10-CM

## 2019-04-25 NOTE — Therapy (Signed)
[place], [place], [address] Phone: [phone]   Fax:  [phone]  Physical Therapy Treatment  Patient Details  Name: Chelsea Cherry MRN: [id number] Date of Birth: September 26, 1970 Referring Provider (PT): Simona Huh, NP   Encounter Date: 04/25/2019  PT End of Session - 04/25/19 1059    Visit Number  18    Number of Visits  20    Date for PT Re-Evaluation  04/06/19    Authorization Type  1/12 to 05/02/2019 3 visits approved    Authorization - Visit Number  1    Authorization - Number of Visits  3    PT Start Time  1050    PT Stop Time  1130    PT Time Calculation (min)  40 min    Activity Tolerance  Patient tolerated treatment well    Behavior During Therapy  [facility] for tasks assessed/performed       Past Medical History:  Diagnosis Date  . Anxiety   . Chronic headaches   . Colon polyps   . Depression   . Fibromyalgia   . Hyperlipidemia   . IBS (irritable bowel syndrome)   . Mitral valve prolapse   . SVT (supraventricular tachycardia) ([place])   . UTI (urinary tract infection)   . Vitamin D deficiency     Past Surgical History:  Procedure Laterality Date  . abdominal laproscopic surgery    . BREAST BIOPSY Right   . BREAST EXCISIONAL BIOPSY Right    x3  . BREAST EXCISIONAL BIOPSY Left   . FOOT SURGERY    . TUBAL LIGATION    . WISDOM TOOTH EXTRACTION      There were no vitals filed for this visit.  Subjective Assessment - 04/25/19 1053    Subjective  Pt arriving to therapy reporting 6.5/10 in low back. Pt reporting her hip is feeling better after the injection. Pt reporting her MD told her about severe bursitis in L hip. Pt reporting fibromyalgia flare up last week is why she missed her appointment.    Pertinent History  [id number] headaches, fibromyalgia,    How long can you sit comfortably?  depends    How long can you stand comfortably?  5-10  min    How long can you walk  comfortably?  5 min    Diagnostic tests  Lumbar MRI 05/2018 "Trace anterolisthesis of L4 on L5 with associated advanced facetarthropathy, with resultant mild bilateral lateral recess stenosis. Facet disease at this level could contribute to lower back pain. Additional DDD and facet hypertrophy at L3-4 and L5-S1 without stenosis.    Patient Stated Goals  get the pain more managed    Currently in Pain?  Yes    Pain Score  7     Pain Location  Back    Pain Orientation  Lower    Pain Descriptors / Indicators  Aching    Pain Type  Chronic pain    Pain Onset  More than a month ago    Pain Frequency  Constant                       [facility] Adult PT Treatment/Exercise - 04/25/19 0001      Lumbar Exercises: Stretches   Single Knee to Chest Stretch  Right;Left;3 reps;10 seconds    Lower Trunk Rotation  2 reps;30 seconds   with legs crossed at knees   Pelvic Tilt  3 reps;10 seconds  ITB Stretch  Right;Left;3 reps;30 seconds    Piriformis Stretch  Right;Left;3 reps;30 seconds      Lumbar Exercises: Aerobic   Nustep  6 minutes L5      Lumbar Exercises: Supine   Clam  10 reps   single leg with TA contraction    Clam Limitations  Pt using hands to maintain pelvic alignment. Pt reporting more "stress" in her L hip, but no back pain with this exercise.     Bridge  15 reps;5 seconds    Isometric Hip Flexion  10 reps;5 seconds    Other Supine Lumbar Exercises  supine marching x 15 with cue to maintain core activation    Other Supine Lumbar Exercises  D1 LE with red theraband   5 reps x 2 sets             PT Education - 04/25/19 1057    Education Details  Discussed hip rotational stretching and exercises.    Person(s) Educated  Patient    Methods  Explanation;Demonstration    Comprehension  Verbalized understanding       PT Short Term Goals - 04/25/19 1105      PT SHORT TERM GOAL #1   Title  Pt will be I and compliant with HEP 4 weeks 12/03/18    Baseline  pt  reporting she has been doing all her stretches issued at her last visit    Time  4    Period  Weeks    Status  Achieved        PT Long Term Goals - 04/13/19 1354      PT LONG TERM GOAL #1   Title  Pt will reduce pain overall 50% (target for all LTG 12 weeks 02/07/2019)    Baseline  avg 6/10 with ADLs, Average was 8/10    Time  8    Period  Weeks    Status  On-going      PT LONG TERM GOAL #2   Title  Pt will improve ROM to [facility] to improve mobility.    Baseline  50% flexion, ext and rotation    Time  8    Period  Weeks    Status  Achieved      PT LONG TERM GOAL #3   Title  Pt will improve strength to at least 5-/5 MMT to improve function    Baseline  5/5 MMT    Time  8    Period  Weeks    Status  Achieved      PT LONG TERM GOAL #4   Title  Pt will improve overall activity tolerance with standing and walking to at least 30 minutes for ADL's    Baseline  Pt reporting she is able to stand/walk about 10 minutes before having to sit    Time  8    Period  Weeks    Status  On-going      PT LONG TERM GOAL #5   Title  Able to ambulate without trendelenburg    Baseline  Lt + Trendelenburg    Status  New            Plan - 04/25/19 1103    Clinical Impression Statement  We discussed she was approved for 3 visits from 04/19/2019 to 05/02/2019. Pt reporting her L hip is feeling "much better" since her injection. Pt reporting incresad LBP. Progressing more hip rotation stretching and strengthening today. Discussed DN not next visit but the  following with Selinda Eon, DPT. Continue skilled PT.    Personal Factors and Comorbidities  Fitness;Comorbidity 1;Comorbidity 2    Comorbidities  [id number] headaches, fibromyalgia, depression    Examination-Activity Limitations  Locomotion Level;Squat;Stairs;Stand;Lift    Examination-Participation Restrictions  Meal Prep;Community Activity;Laundry;Shop    Stability/Clinical Decision Making  Evolving/Moderate complexity    Rehab  Potential  Good    PT Frequency  2x / week    PT Duration  6 weeks    PT Treatment/Interventions  Aquatic Therapy;Electrical Stimulation;Moist Heat;Traction;Ultrasound;Therapeutic activities;Therapeutic exercise;Neuromuscular re-education;Manual techniques;Passive range of motion;Dry needling;Spinal Manipulations;Joint Manipulations;Taping    PT Next Visit Plan  Re-submit for MCD visits. continue gait training to reduce hip drop, foot intrinsic strengthening    PT Home Exercise Plan  Access Code: [email or web address], verbally added hip adduction with ball, tub exercises., physioball exercises    Consulted and Agree with Plan of Care  Patient       Patient will benefit from skilled therapeutic intervention in order to improve the following deficits and impairments:  Decreased activity tolerance, Decreased endurance, Decreased range of motion, Decreased strength, Difficulty walking, Postural dysfunction, Pain, Increased muscle spasms  Visit Diagnosis: Chronic bilateral low back pain with bilateral sciatica  Muscle weakness (generalized)  Left hip pain     Problem List Patient Active Problem List   Diagnosis Date Noted  . Bipolar II disorder ([place])   . MDD (major depressive disorder), recurrent severe, without psychosis ([place]) 04/04/2019  . MDD (major depressive disorder), recurrent episode, moderate ([place]) 04/17/2017    Oretha Caprice , PT 04/25/2019, 11:27 AM  [facility] [address] [place], [place], [address] Phone: [phone]   Fax:  [phone]  Name: Chelsea Cherry MRN: [id number] Date of Birth: April 02, 1971

## 2019-04-27 ENCOUNTER — Ambulatory Visit: Payer: Medicaid Other | Admitting: Physical Therapy

## 2019-04-28 ENCOUNTER — Encounter: Payer: Self-pay | Admitting: Physical Therapy

## 2019-04-28 ENCOUNTER — Other Ambulatory Visit: Payer: Self-pay

## 2019-04-28 ENCOUNTER — Ambulatory Visit: Payer: Medicaid Other | Admitting: Physical Therapy

## 2019-04-28 DIAGNOSIS — G8929 Other chronic pain: Secondary | ICD-10-CM

## 2019-04-28 DIAGNOSIS — M5442 Lumbago with sciatica, left side: Secondary | ICD-10-CM | POA: Diagnosis not present

## 2019-04-28 DIAGNOSIS — M25552 Pain in left hip: Secondary | ICD-10-CM

## 2019-04-28 DIAGNOSIS — M6281 Muscle weakness (generalized): Secondary | ICD-10-CM

## 2019-04-28 NOTE — Therapy (Signed)
[place], [place], [address] Phone: [phone]   Fax:  [phone]  Physical Therapy Treatment/ ERO  Patient Details  Name: Chelsea Cherry MRN: [id number] Date of Birth: May 18, 1970 Referring Provider (PT): Simona Huh, NP   Encounter Date: 04/28/2019  PT End of Session - 04/28/19 1153    Visit Number  19    Date for PT Re-Evaluation  07/01/19    Authorization Time Period  1/12-1/25    Authorization - Visit Number  2    Authorization - Number of Visits  3    PT Start Time  [id number]   pt arrived late   PT Stop Time  1230    PT Time Calculation (min)  39 min    Activity Tolerance  Patient tolerated treatment well    Behavior During Therapy  [facility] for tasks assessed/performed       Past Medical History:  Diagnosis Date  . Anxiety   . Chronic headaches   . Colon polyps   . Depression   . Fibromyalgia   . Hyperlipidemia   . IBS (irritable bowel syndrome)   . Mitral valve prolapse   . SVT (supraventricular tachycardia) ([place])   . UTI (urinary tract infection)   . Vitamin D deficiency     Past Surgical History:  Procedure Laterality Date  . abdominal laproscopic surgery    . BREAST BIOPSY Right   . BREAST EXCISIONAL BIOPSY Right    x3  . BREAST EXCISIONAL BIOPSY Left   . FOOT SURGERY    . TUBAL LIGATION    . WISDOM TOOTH EXTRACTION      There were no vitals filed for this visit.  Subjective Assessment - 04/28/19 1154    Subjective  What has been improved: strength in legs and arms, I feel that the pain has lessened. What needs to improve: length of being able to stand.    How long can you stand comfortably?  15 min    Diagnostic tests  Lumbar MRI 05/2018 "Trace anterolisthesis of L4 on L5 with associated advanced facetarthropathy, with resultant mild bilateral lateral recess stenosis. Facet disease at this level could contribute to lower back pain. Additional DDD and facet hypertrophy at  L3-4 and L5-S1 without stenosis.    Patient Stated Goals  get the pain more managed    Currently in Pain?  Yes    Pain Score  4     Pain Location  Back    Pain Orientation  Lower    Pain Descriptors / Indicators  Sore;Aching;Sharp    Pain Type  Chronic pain    Pain Frequency  Constant    Aggravating Factors   fibro flares    Pain Relieving Factors  prednisone, stretches         [facility] PT Assessment - 04/28/19 0001      Assessment   Medical Diagnosis  lumbar pain with radiculopathy    Referring Provider (PT)  Simona Huh, NP      Balance Screen   Has the patient fallen in the past 6 months  No      Prior Function   Level of Independence  Independent    Vocation Requirements  Pt is a private driver for clients      Cognition   Overall Cognitive Status  Within Functional Limits for tasks assessed      AROM   Overall AROM Comments  gross lumbar ROM [facility]      Strength  Overall Strength Comments  grossly 5/5      Palpation   Palpation comment  TTP Lt ITB & TFL      Ambulation/Gait   Gait Comments  + Lt trendelenburg                   [facility] Adult PT Treatment/Exercise - 04/28/19 0001      Manual Therapy   Manual therapy comments  skilled palpation and monitoring during TPDN    Soft tissue mobilization  LT TFL, glut med/min; assisted in ITB stretch in sidelying       Trigger Point Dry Needling - 04/28/19 0001    Muscles Treated Back/Hip  Tensor fascia lata    Tensor Fascia Lata Response  Twitch response elicited;Palpable increased muscle length   Left          PT Education - 04/28/19 1306    Education Details  goals, progress, POC, incoorporating HEP into daily activities, walking program & challenging endurance    Person(s) Educated  Patient    Methods  Explanation    Comprehension  Verbalized understanding;Need further instruction       PT Short Term Goals - 04/25/19 1105      PT SHORT TERM GOAL #1   Title  Pt will be I and compliant  with HEP 4 weeks 12/03/18    Baseline  pt reporting she has been doing all her stretches issued at her last visit    Time  4    Period  Weeks    Status  Achieved        PT Long Term Goals - 04/28/19 1158      PT LONG TERM GOAL #1   Title  Pt will reduce pain overall 50% (target for all LTG 12 weeks 02/07/2019)    Baseline  Eval: avg 8/10, past re-eval: 6/10, today: base 6/10 with regular pain meds    Status  On-going      PT LONG TERM GOAL #2   Title  Pt will improve ROM to [facility] to improve mobility.    Status  Achieved      PT LONG TERM GOAL #3   Title  Pt will improve strength to at least 5-/5 MMT to improve function    Baseline  5/5 MMT    Status  Achieved      PT LONG TERM GOAL #4   Title  Pt will improve overall activity tolerance with standing and walking to at least 30 minutes for ADL's    Baseline  I notice I am able to handle the grocery store without grasping my back    Status  Achieved      PT LONG TERM GOAL #5   Title  Able to ambulate without trendelenburg    Baseline  Lt + Trendelenburg    Status  On-going            Plan - 04/28/19 1302    Clinical Impression Statement  Pt has continued to make progress toward long term functional goals and is working on her HEP. At this time, I believe it is clinically appropriate to spread her appointmens to 1/week so she is able to challenge her endurance in daily activities. She reports she is having a hard time finding time to do her exercises regularly and we discussed how important this is to do. Pt will benefit from continued skilled PT in order to assist in creating proper walking program, progress strengthening and control pain  levels to meet functional goals.    Comorbidities  [id number] headaches, fibromyalgia, depression    PT Frequency  1x / week    PT Duration  8 weeks    PT Treatment/Interventions  Aquatic Therapy;Electrical Stimulation;Moist Heat;Traction;Ultrasound;Therapeutic activities;Therapeutic  exercise;Neuromuscular re-education;Manual techniques;Passive range of motion;Dry needling;Spinal Manipulations;Joint Manipulations;Taping    PT Next Visit Plan  how did walking go? (10 min)    add resistance bands for strengthening    PT Home Exercise Plan  Access Code: [email or web address], verbally added hip adduction with ball, tub exercises., physioball exercises    Consulted and Agree with Plan of Care  Patient       Patient will benefit from skilled therapeutic intervention in order to improve the following deficits and impairments:  Decreased activity tolerance, Decreased endurance, Decreased range of motion, Decreased strength, Difficulty walking, Postural dysfunction, Pain, Increased muscle spasms  Visit Diagnosis: Chronic bilateral low back pain with bilateral sciatica - Plan: PT plan of care cert/re-cert  Muscle weakness (generalized) - Plan: PT plan of care cert/re-cert  Left hip pain - Plan: PT plan of care cert/re-cert     Problem List Patient Active Problem List   Diagnosis Date Noted  . Bipolar II disorder ([place])   . MDD (major depressive disorder), recurrent severe, without psychosis ([place]) 04/04/2019  . MDD (major depressive disorder), recurrent episode, moderate ([place]) 04/17/2017    Khrystyne Arpin C. Geneen Dieter PT, DPT 04/28/19 1:10 PM   [facility] [address] [place], [place], [address] Phone: [phone]   Fax:  [phone]  Name: Marlenne Poleski MRN: [id number] Date of Birth: 1970/08/17

## 2019-05-03 ENCOUNTER — Encounter: Payer: Medicaid Other | Admitting: Physical Therapy

## 2019-05-05 ENCOUNTER — Encounter: Payer: Self-pay | Admitting: Physical Therapy

## 2019-05-05 ENCOUNTER — Other Ambulatory Visit: Payer: Self-pay

## 2019-05-05 ENCOUNTER — Ambulatory Visit: Payer: Medicaid Other | Admitting: Physical Therapy

## 2019-05-05 DIAGNOSIS — G8929 Other chronic pain: Secondary | ICD-10-CM

## 2019-05-05 DIAGNOSIS — M25552 Pain in left hip: Secondary | ICD-10-CM

## 2019-05-05 DIAGNOSIS — M5441 Lumbago with sciatica, right side: Secondary | ICD-10-CM

## 2019-05-05 DIAGNOSIS — M5442 Lumbago with sciatica, left side: Secondary | ICD-10-CM | POA: Diagnosis not present

## 2019-05-05 DIAGNOSIS — M6281 Muscle weakness (generalized): Secondary | ICD-10-CM

## 2019-05-05 NOTE — Therapy (Signed)
[place], [place], [address] Phone: [phone]   Fax:  [phone]  Physical Therapy Treatment  Patient Details  Name: Chelsea Cherry MRN: [id number] Date of Birth: April 11, 1970 Referring Provider (PT): Simona Huh, NP   Encounter Date: 05/05/2019  PT End of Session - 05/05/19 1150    Visit Number  20    Authorization Type  MCD    Authorization Time Period  1/27-3/2    Authorization - Visit Number  1    Authorization - Number of Visits  10    PT Start Time  1150    PT Stop Time  1230    PT Time Calculation (min)  40 min    Activity Tolerance  Patient tolerated treatment well    Behavior During Therapy  [facility] for tasks assessed/performed       Past Medical History:  Diagnosis Date  . Anxiety   . Chronic headaches   . Colon polyps   . Depression   . Fibromyalgia   . Hyperlipidemia   . IBS (irritable bowel syndrome)   . Mitral valve prolapse   . SVT (supraventricular tachycardia) ([place])   . UTI (urinary tract infection)   . Vitamin D deficiency     Past Surgical History:  Procedure Laterality Date  . abdominal laproscopic surgery    . BREAST BIOPSY Right   . BREAST EXCISIONAL BIOPSY Right    x3  . BREAST EXCISIONAL BIOPSY Left   . FOOT SURGERY    . TUBAL LIGATION    . WISDOM TOOTH EXTRACTION      There were no vitals filed for this visit.                    [facility] Adult PT Treatment/Exercise - 05/05/19 0001      Lumbar Exercises: Aerobic   Nustep  10 min L6 Ue & LE             PT Education - 05/05/19 1257    Education Details  see plan    Person(s) Educated  Patient    Methods  Explanation    Comprehension  Verbalized understanding;Need further instruction       PT Short Term Goals - 04/25/19 1105      PT SHORT TERM GOAL #1   Title  Pt will be I and compliant with HEP 4 weeks 12/03/18    Baseline  pt reporting she has been doing all her stretches  issued at her last visit    Time  4    Period  Weeks    Status  Achieved        PT Long Term Goals - 04/28/19 1158      PT LONG TERM GOAL #1   Title  Pt will reduce pain overall 50% (target for all LTG 12 weeks 02/07/2019)    Baseline  Eval: avg 8/10, past re-eval: 6/10, today: base 6/10 with regular pain meds    Status  On-going      PT LONG TERM GOAL #2   Title  Pt will improve ROM to [facility] to improve mobility.    Status  Achieved      PT LONG TERM GOAL #3   Title  Pt will improve strength to at least 5-/5 MMT to improve function    Baseline  5/5 MMT    Status  Achieved      PT LONG TERM GOAL #4   Title  Pt  will improve overall activity tolerance with standing and walking to at least 30 minutes for ADL's    Baseline  I notice I am able to handle the grocery store without grasping my back    Status  Achieved      PT LONG TERM GOAL #5   Title  Able to ambulate without trendelenburg    Baseline  Lt + Trendelenburg    Status  On-going            Plan - 05/05/19 1254    Clinical Impression Statement  Pt under a high amount of stress within her family and was tachycardic when she saw her cardiologist. She was very upset when she arrived so we took the time to sit and discuss. She did ride the nustep after calming down and will continue as tolerated with cardiac system in mind.    PT Treatment/Interventions  Aquatic Therapy;Electrical Stimulation;Moist Heat;Traction;Ultrasound;Therapeutic activities;Therapeutic exercise;Neuromuscular re-education;Manual techniques;Passive range of motion;Dry needling;Spinal Manipulations;Joint Manipulations;Taping    PT Next Visit Plan  how did walking go? (10 min)    add resistance bands for strengthening    PT Home Exercise Plan  Access Code: [email or web address], verbally added hip adduction with ball, tub exercises., physioball exercises    Consulted and Agree with Plan of Care  Patient       Patient will benefit from skilled therapeutic  intervention in order to improve the following deficits and impairments:  Decreased activity tolerance, Decreased endurance, Decreased range of motion, Decreased strength, Difficulty walking, Postural dysfunction, Pain, Increased muscle spasms  Visit Diagnosis: Chronic bilateral low back pain with bilateral sciatica  Muscle weakness (generalized)  Left hip pain     Problem List Patient Active Problem List   Diagnosis Date Noted  . Bipolar II disorder ([place])   . MDD (major depressive disorder), recurrent severe, without psychosis ([place]) 04/04/2019  . MDD (major depressive disorder), recurrent episode, moderate ([place]) 04/17/2017    Ademola Vert C. Madeleine Fenn PT, DPT 05/05/19 12:58 PM   [place] [facility] [address] [place], [place], [address] Phone: [phone]   Fax:  [phone]  Name: Chelsea Cherry MRN: [id number] Date of Birth: [birth year]

## 2019-05-10 ENCOUNTER — Encounter: Payer: Self-pay | Admitting: Physical Therapy

## 2019-05-10 ENCOUNTER — Ambulatory Visit: Payer: Medicaid Other | Attending: Nurse Practitioner | Admitting: Physical Therapy

## 2019-05-10 ENCOUNTER — Other Ambulatory Visit: Payer: Self-pay

## 2019-05-10 DIAGNOSIS — M25552 Pain in left hip: Secondary | ICD-10-CM | POA: Diagnosis present

## 2019-05-10 DIAGNOSIS — M5442 Lumbago with sciatica, left side: Secondary | ICD-10-CM | POA: Diagnosis present

## 2019-05-10 DIAGNOSIS — G8929 Other chronic pain: Secondary | ICD-10-CM | POA: Diagnosis present

## 2019-05-10 DIAGNOSIS — M6281 Muscle weakness (generalized): Secondary | ICD-10-CM | POA: Insufficient documentation

## 2019-05-10 DIAGNOSIS — M5441 Lumbago with sciatica, right side: Secondary | ICD-10-CM | POA: Insufficient documentation

## 2019-05-10 NOTE — Therapy (Signed)
[place], [place], [address] Phone: [phone]   Fax:  [phone]  Physical Therapy Treatment  Patient Details  Name: Chelsea Cherry MRN: [id number] Date of Birth: Jun 06, 1970 Referring Provider (PT): Simona Huh, NP   Encounter Date: 05/10/2019  PT End of Session - 05/10/19 1201    Visit Number  21    Date for PT Re-Evaluation  07/01/19    Authorization Type  MCD    Authorization Time Period  1/27-3/2    Authorization - Visit Number  2    Authorization - Number of Visits  10    PT Start Time  [id number]   pt arrived late   PT Stop Time  1230    PT Time Calculation (min)  32 min    Activity Tolerance  Patient tolerated treatment well    Behavior During Therapy  [facility] for tasks assessed/performed       Past Medical History:  Diagnosis Date  . Anxiety   . Chronic headaches   . Colon polyps   . Depression   . Fibromyalgia   . Hyperlipidemia   . IBS (irritable bowel syndrome)   . Mitral valve prolapse   . SVT (supraventricular tachycardia) ([place])   . UTI (urinary tract infection)   . Vitamin D deficiency     Past Surgical History:  Procedure Laterality Date  . abdominal laproscopic surgery    . BREAST BIOPSY Right   . BREAST EXCISIONAL BIOPSY Right    x3  . BREAST EXCISIONAL BIOPSY Left   . FOOT SURGERY    . TUBAL LIGATION    . WISDOM TOOTH EXTRACTION      There were no vitals filed for this visit.  Subjective Assessment - 05/10/19 1200    Subjective  lower back is just a little stiff.    Diagnostic tests  Lumbar MRI 05/2018 "Trace anterolisthesis of L4 on L5 with associated advanced facetarthropathy, with resultant mild bilateral lateral recess stenosis. Facet disease at this level could contribute to lower back pain. Additional DDD and facet hypertrophy at L3-4 and L5-S1 without stenosis.    Patient Stated Goals  get the pain more managed         [facility] PT Assessment - 05/10/19 0001       6 minute walk test results    Aerobic Endurance Distance Walked  1323    Endurance additional comments  no rest break, HR to 106, no LE pain with mild LBP                   [facility] Adult PT Treatment/Exercise - 05/10/19 0001      Lumbar Exercises: Aerobic   Nustep  5 min L7 UE & LE      Lumbar Exercises: Supine   Other Supine Lumbar Exercises  squat+reach, lunges, side stepping               PT Short Term Goals - 04/25/19 1105      PT SHORT TERM GOAL #1   Title  Pt will be I and compliant with HEP 4 weeks 12/03/18    Baseline  pt reporting she has been doing all her stretches issued at her last visit    Time  4    Period  Weeks    Status  Achieved        PT Long Term Goals - 04/28/19 1158      PT LONG TERM GOAL #1  Title  Pt will reduce pain overall 50% (target for all LTG 12 weeks 02/07/2019)    Baseline  Eval: avg 8/10, past re-eval: 6/10, today: base 6/10 with regular pain meds    Status  On-going      PT LONG TERM GOAL #2   Title  Pt will improve ROM to [facility] to improve mobility.    Status  Achieved      PT LONG TERM GOAL #3   Title  Pt will improve strength to at least 5-/5 MMT to improve function    Baseline  5/5 MMT    Status  Achieved      PT LONG TERM GOAL #4   Title  Pt will improve overall activity tolerance with standing and walking to at least 30 minutes for ADL's    Baseline  I notice I am able to handle the grocery store without grasping my back    Status  Achieved      PT LONG TERM GOAL #5   Title  Able to ambulate without trendelenburg    Baseline  Lt + Trendelenburg    Status  On-going            Plan - 05/10/19 1232    Clinical Impression Statement  Tested 6MWT ability today and was able to maintain level pelvis in gait and denied increase in pain. Discussed her distance and will continue to progress challenges. Exercises for full body engagement for functional strength and endurance.    PT Treatment/Interventions   Aquatic Therapy;Electrical Stimulation;Moist Heat;Traction;Ultrasound;Therapeutic activities;Therapeutic exercise;Neuromuscular re-education;Manual techniques;Passive range of motion;Dry needling;Spinal Manipulations;Joint Manipulations;Taping    PT Next Visit Plan  continue full-body    PT Home Exercise Plan  Access Code: [email or web address], verbally added hip adduction with ball, tub exercises., physioball exercises    Consulted and Agree with Plan of Care  Patient       Patient will benefit from skilled therapeutic intervention in order to improve the following deficits and impairments:  Decreased activity tolerance, Decreased endurance, Decreased range of motion, Decreased strength, Difficulty walking, Postural dysfunction, Pain, Increased muscle spasms  Visit Diagnosis: Chronic bilateral low back pain with bilateral sciatica  Muscle weakness (generalized)  Left hip pain     Problem List Patient Active Problem List   Diagnosis Date Noted  . Bipolar II disorder ([place])   . MDD (major depressive disorder), recurrent severe, without psychosis ([place]) 04/04/2019  . MDD (major depressive disorder), recurrent episode, moderate ([place]) 04/17/2017   Lamel Mccarley C. Alias Villagran PT, DPT 05/10/19 12:35 PM   [facility] [address] [place], [place], [address] Phone: [phone]   Fax:  [phone]  Name: Chelsea Cherry MRN: [id number] Date of Birth: 01/05/1971

## 2019-05-12 ENCOUNTER — Ambulatory Visit: Payer: Medicaid Other | Admitting: Physical Therapy

## 2019-05-17 ENCOUNTER — Ambulatory Visit: Payer: Medicaid Other | Admitting: Physical Therapy

## 2019-05-19 ENCOUNTER — Ambulatory Visit: Payer: Medicaid Other | Admitting: Physical Therapy

## 2019-05-19 ENCOUNTER — Encounter: Payer: Self-pay | Admitting: Physical Therapy

## 2019-05-19 ENCOUNTER — Other Ambulatory Visit: Payer: Self-pay

## 2019-05-19 DIAGNOSIS — M5442 Lumbago with sciatica, left side: Secondary | ICD-10-CM

## 2019-05-19 DIAGNOSIS — G8929 Other chronic pain: Secondary | ICD-10-CM

## 2019-05-19 DIAGNOSIS — M25552 Pain in left hip: Secondary | ICD-10-CM

## 2019-05-19 DIAGNOSIS — M6281 Muscle weakness (generalized): Secondary | ICD-10-CM

## 2019-05-19 NOTE — Therapy (Signed)
[place], [place], [address] Phone: [phone]   Fax:  [phone]  Physical Therapy Treatment  Patient Details  Name: Chelsea Cherry MRN: [id number] Date of Birth: [birth year] Referring Provider (PT): Simona Huh, NP   Encounter Date: 05/19/2019  PT End of Session - 05/19/19 1425    Visit Number  22    Date for PT Re-Evaluation  07/01/19    Authorization Type  MCD    Authorization Time Period  1/27-3/2    Authorization - Visit Number  3    Authorization - Number of Visits  10    PT Start Time  [id number]   started by KL until 225p   PT Stop Time  1455    PT Time Calculation (min)  40 min    Activity Tolerance  Patient tolerated treatment well    Behavior During Therapy  [facility] for tasks assessed/performed       Past Medical History:  Diagnosis Date  . Anxiety   . Chronic headaches   . Colon polyps   . Depression   . Fibromyalgia   . Hyperlipidemia   . IBS (irritable bowel syndrome)   . Mitral valve prolapse   . SVT (supraventricular tachycardia) ([place])   . UTI (urinary tract infection)   . Vitamin D deficiency     Past Surgical History:  Procedure Laterality Date  . abdominal laproscopic surgery    . BREAST BIOPSY Right   . BREAST EXCISIONAL BIOPSY Right    x3  . BREAST EXCISIONAL BIOPSY Left   . FOOT SURGERY    . TUBAL LIGATION    . WISDOM TOOTH EXTRACTION      There were no vitals filed for this visit.  Subjective Assessment - 05/19/19 1421    Subjective  "My back is still stiff, and unfortunatley I have been getting some fibro flare-ups with increased exhaustion."    Diagnostic tests  Lumbar MRI 05/2018 "Trace anterolisthesis of L4 on L5 with associated advanced facetarthropathy, with resultant mild bilateral lateral recess stenosis. Facet disease at this level could contribute to lower back pain. Additional DDD and facet hypertrophy at L3-4 and L5-S1 without stenosis.    Currently  in Pain?  Yes    Pain Score  6     Pain Location  Back    Pain Orientation  Lower    Pain Type  Chronic pain    Pain Onset  More than a month ago    Pain Frequency  Constant    Aggravating Factors   fibro flares    Pain Relieving Factors  prednisone, stretches                       [facility] Adult PT Treatment/Exercise - 05/19/19 0001      Exercises   Exercises  Other Exercises    Other Exercises   with physioball- see scanned instructions      Lumbar Exercises: Aerobic   Stationary Bike  5 min L3 end of session    Nustep  7 min L6 UE & LE               PT Short Term Goals - 04/25/19 1105      PT SHORT TERM GOAL #1   Title  Pt will be I and compliant with HEP 4 weeks 12/03/18    Baseline  pt reporting she has been doing all her stretches issued at her last visit  Time  4    Period  Weeks    Status  Achieved        PT Long Term Goals - 04/28/19 1158      PT LONG TERM GOAL #1   Title  Pt will reduce pain overall 50% (target for all LTG 12 weeks 02/07/2019)    Baseline  Eval: avg 8/10, past re-eval: 6/10, today: base 6/10 with regular pain meds    Status  On-going      PT LONG TERM GOAL #2   Title  Pt will improve ROM to [facility] to improve mobility.    Status  Achieved      PT LONG TERM GOAL #3   Title  Pt will improve strength to at least 5-/5 MMT to improve function    Baseline  5/5 MMT    Status  Achieved      PT LONG TERM GOAL #4   Title  Pt will improve overall activity tolerance with standing and walking to at least 30 minutes for ADL's    Baseline  I notice I am able to handle the grocery store without grasping my back    Status  Achieved      PT LONG TERM GOAL #5   Title  Able to ambulate without trendelenburg    Baseline  Lt + Trendelenburg    Status  On-going            Plan - 05/19/19 1455    Clinical Impression Statement  Continued with full-body challenges using pysioball which pt tolerated well. Is now able to verbalize  muscle activation and recognize need for postural changes    PT Treatment/Interventions  Aquatic Therapy;Electrical Stimulation;Moist Heat;Traction;Ultrasound;Therapeutic activities;Therapeutic exercise;Neuromuscular re-education;Manual techniques;Passive range of motion;Dry needling;Spinal Manipulations;Joint Manipulations;Taping    PT Next Visit Plan  continue full-body    PT Home Exercise Plan  Access Code: [email or web address], verbally added hip adduction with ball, tub exercises., physioball exercises    Consulted and Agree with Plan of Care  Patient       Patient will benefit from skilled therapeutic intervention in order to improve the following deficits and impairments:  Decreased activity tolerance, Decreased endurance, Decreased range of motion, Decreased strength, Difficulty walking, Postural dysfunction, Pain, Increased muscle spasms  Visit Diagnosis: Chronic bilateral low back pain with bilateral sciatica  Muscle weakness (generalized)  Left hip pain     Problem List Patient Active Problem List   Diagnosis Date Noted  . Bipolar II disorder ([place])   . MDD (major depressive disorder), recurrent severe, without psychosis ([place]) 04/04/2019  . MDD (major depressive disorder), recurrent episode, moderate ([place]) 04/17/2017    Abdiel Blackerby C. [place] Hanlon PT, DPT 05/19/19 3:00 PM   [place], [place], [address] Phone: [phone]   Fax:  [phone]  Name: Chelsea Cherry MRN: [id number] Date of Birth: 1970/08/16

## 2019-05-20 ENCOUNTER — Emergency Department (HOSPITAL_COMMUNITY): Payer: Medicaid Other

## 2019-05-20 ENCOUNTER — Emergency Department (HOSPITAL_COMMUNITY)
Admission: EM | Admit: 2019-05-20 | Discharge: 2019-05-20 | Disposition: A | Discharge status: Home / Self Care | Payer: Medicaid Other | Attending: Emergency Medicine | Admitting: Emergency Medicine

## 2019-05-20 DIAGNOSIS — Z7982 Long term (current) use of aspirin: Secondary | ICD-10-CM | POA: Insufficient documentation

## 2019-05-20 DIAGNOSIS — Z79899 Other long term (current) drug therapy: Secondary | ICD-10-CM | POA: Insufficient documentation

## 2019-05-20 DIAGNOSIS — N201 Calculus of ureter: Secondary | ICD-10-CM | POA: Insufficient documentation

## 2019-05-20 DIAGNOSIS — Z7984 Long term (current) use of oral hypoglycemic drugs: Secondary | ICD-10-CM | POA: Insufficient documentation

## 2019-05-20 DIAGNOSIS — Z87891 Personal history of nicotine dependence: Secondary | ICD-10-CM | POA: Diagnosis not present

## 2019-05-20 DIAGNOSIS — R1032 Left lower quadrant pain: Secondary | ICD-10-CM | POA: Diagnosis present

## 2019-05-20 LAB — CBC WITH DIFFERENTIAL/PLATELET
Abs Immature Granulocytes: 0.03 10*3/uL (ref 0.00–0.07)
Basophils Absolute: 0 10*3/uL (ref 0.0–0.1)
Basophils Relative: 0 %
Eosinophils Absolute: 0 10*3/uL (ref 0.0–0.5)
Eosinophils Relative: 0 %
HCT: 39.7 % (ref 36.0–46.0)
Hemoglobin: 13.5 g/dL (ref 12.0–15.0)
Immature Granulocytes: 0 %
Lymphocytes Relative: 13 %
Lymphs Abs: 1.4 10*3/uL (ref 0.7–4.0)
MCH: 29.6 pg (ref 26.0–34.0)
MCHC: 34 g/dL (ref 30.0–36.0)
MCV: 87.1 fL (ref 80.0–100.0)
Monocytes Absolute: 1 10*3/uL (ref 0.1–1.0)
Monocytes Relative: 9 %
Neutro Abs: 8.7 10*3/uL — ABNORMAL HIGH (ref 1.7–7.7)
Neutrophils Relative %: 78 %
Platelets: 241 10*3/uL (ref 150–400)
RBC: 4.56 MIL/uL (ref 3.87–5.11)
RDW: 12.9 % (ref 11.5–15.5)
WBC: 11.1 10*3/uL — ABNORMAL HIGH (ref 4.0–10.5)
nRBC: 0 % (ref 0.0–0.2)

## 2019-05-20 LAB — URINALYSIS, ROUTINE W REFLEX MICROSCOPIC
Bilirubin Urine: NEGATIVE
Glucose, UA: NEGATIVE mg/dL
Ketones, ur: NEGATIVE mg/dL
Leukocytes,Ua: NEGATIVE
Nitrite: NEGATIVE
Protein, ur: NEGATIVE mg/dL
Specific Gravity, Urine: 1.005 (ref 1.005–1.030)
pH: 8 (ref 5.0–8.0)

## 2019-05-20 LAB — COMPREHENSIVE METABOLIC PANEL
ALT: 23 U/L (ref 0–44)
AST: 28 U/L (ref 15–41)
Albumin: 3.9 g/dL (ref 3.5–5.0)
Alkaline Phosphatase: 72 U/L (ref 38–126)
Anion gap: 11 (ref 5–15)
BUN: 10 mg/dL (ref 6–20)
CO2: 20 mmol/L — ABNORMAL LOW (ref 22–32)
Calcium: 8.9 mg/dL (ref 8.9–10.3)
Chloride: 108 mmol/L (ref 98–111)
Creatinine, Ser: 0.85 mg/dL (ref 0.44–1.00)
GFR calc Af Amer: >60 mL/min (ref >60)
GFR calc non Af Amer: >60 mL/min (ref >60)
Glucose, Bld: 115 mg/dL — ABNORMAL HIGH (ref 70–99)
Potassium: 3.5 mmol/L (ref 3.5–5.1)
Sodium: 139 mmol/L (ref 135–145)
Total Bilirubin: 0.7 mg/dL (ref 0.3–1.2)
Total Protein: 6.9 g/dL (ref 6.5–8.1)

## 2019-05-20 LAB — CBG MONITORING, ED: Glucose-Capillary: 100 mg/dL — ABNORMAL HIGH (ref 70–99)

## 2019-05-20 LAB — I-STAT BETA HCG BLOOD, ED (MC, WL, AP ONLY): I-stat hCG, quantitative: <5 m[IU]/mL (ref <5)

## 2019-05-20 LAB — LIPASE, BLOOD: Lipase: 33 U/L (ref 11–51)

## 2019-05-20 MED ORDER — SODIUM CHLORIDE (PF) 0.9 % IJ SOLN
INTRAMUSCULAR | Status: AC
  Filled 2019-05-20: qty 50

## 2019-05-20 MED ORDER — KETOROLAC TROMETHAMINE 30 MG/ML IJ SOLN
30.0000 mg | Freq: Once | INTRAMUSCULAR | Status: AC
  Administered 2019-05-20: 16:00:00 30 mg via INTRAVENOUS
  Filled 2019-05-20: qty 1

## 2019-05-20 MED ORDER — TAMSULOSIN HCL 0.4 MG PO CAPS: 0.4000 mg | ORAL_CAPSULE | Freq: Every day | ORAL | 0 refills | Status: DC

## 2019-05-20 MED ORDER — MORPHINE SULFATE (PF) 4 MG/ML IV SOLN
4.0000 mg | Freq: Once | INTRAVENOUS | Status: AC
  Administered 2019-05-20: 4 mg via INTRAVENOUS
  Filled 2019-05-20: qty 1

## 2019-05-20 MED ORDER — IOHEXOL 300 MG/ML  SOLN
100.0000 mL | Freq: Once | INTRAMUSCULAR | Status: AC | PRN
  Administered 2019-05-20: 100 mL via INTRAVENOUS

## 2019-05-20 NOTE — ED Provider Notes (Signed)
Care transferred from previous provider [place], [place]. See note for full HPI.  In summation 49 year old presents for Left flank and back pain this morning. Associated N without emesis. Takes home pain meds. Urinary pressure here in ED  Urine and CT pending.  Likely dc home with ABX of CT AP without surgical complication  Physical Exam  BP 122/65   Pulse 69   Temp 98.2 F (36.8 C) (Oral)   Resp 15   SpO2 98%   Physical Exam Vitals and nursing note reviewed.  Constitutional:      General: She is not in acute distress.    Appearance: She is well-developed. She is not ill-appearing, toxic-appearing or diaphoretic.  HENT:     Head: Normocephalic and atraumatic.     Mouth/Throat:     Mouth: Mucous membranes are moist.     Pharynx: Oropharynx is clear.  Eyes:     Pupils: Pupils are equal, round, and reactive to light.  Cardiovascular:     Rate and Rhythm: Normal rate.     Heart sounds: Normal heart sounds.  Pulmonary:     Effort: Pulmonary effort is normal. No respiratory distress.     Breath sounds: Normal breath sounds.  Abdominal:     General: Bowel sounds are normal. There is no distension.     Tenderness: There is abdominal tenderness in the left lower quadrant. There is no right CVA tenderness, left CVA tenderness, guarding or rebound. Negative signs include Murphy's sign, Rovsing's sign, McBurney's sign and psoas sign.     Hernia: No hernia is present.  Musculoskeletal:        General: Normal range of motion.     Cervical back: Normal range of motion.     Comments: No midline spinal tenderness, step offs, overlying skins changes  Skin:    General: Skin is warm and dry.  Neurological:     Mental Status: She is alert.    ED Course/Procedures     Procedures Labs Reviewed  COMPREHENSIVE METABOLIC PANEL - Abnormal; Notable for the following components:      Result Value   CO2 20 (*)    Glucose, Bld 115 (*)    All other components within normal limits  CBC WITH  DIFFERENTIAL/PLATELET - Abnormal; Notable for the following components:   WBC 11.1 (*)    Neutro Abs 8.7 (*)    All other components within normal limits  URINALYSIS, ROUTINE W REFLEX MICROSCOPIC - Abnormal; Notable for the following components:   Hgb urine dipstick LARGE (*)    Bacteria, UA RARE (*)    All other components within normal limits  CBG MONITORING, ED - Abnormal; Notable for the following components:   Glucose-Capillary 100 (*)    All other components within normal limits  URINE CULTURE  LIPASE, BLOOD  I-STAT BETA HCG BLOOD, ED (MC, [facility], AP ONLY)  CT ABDOMEN PELVIS W CONTRAST  Result Date: 05/20/2019 CLINICAL DATA:  LEFT-sided abdominal pain. Pain since 08/30 this morning. EXAM: CT ABDOMEN AND PELVIS WITH CONTRAST TECHNIQUE: Multidetector CT imaging of the abdomen and pelvis was performed using the standard protocol following bolus administration of intravenous contrast. CONTRAST:  127mL OMNIPAQUE IOHEXOL 300 MG/ML  SOLN COMPARISON:  None. FINDINGS: Lower chest: Lung bases are clear. Hepatobiliary: No focal hepatic lesion. No biliary duct dilatation. Gallbladder is normal. Common bile duct is normal. Pancreas: Pancreas is normal. No ductal dilatation. No pancreatic inflammation. Spleen: Normal spleen Adrenals/urinary tract: Adrenal glands normal. No nephrolithiasis identified. No  enhancing renal cortical lesion. Mild hydroureter on the LEFT. There is a partially obstructing calculus in the distal LEFT ureter at the LEFT vesicoureteral junction. This small calculus measures 1 mm on image 80/2. Stomach/Bowel: Stomach, small bowel, appendix, and cecum are normal. The colon and rectosigmoid colon are normal. Vascular/Lymphatic: Abdominal aorta is normal caliber. No periportal or retroperitoneal adenopathy. No pelvic adenopathy. Reproductive: Uterus and ovaries normal. Other: No free fluid. Musculoskeletal: No aggressive osseous lesion. IMPRESSION: Small partially obstructing calculus in the  distal LEFT ureter at the vesicoureteral junction. Electronically Signed   By: Suzy Bouchard M.D.   On: 05/20/2019 15:59   MDM  49 year old presents for flank pain onset this am with urinary sx. Labs at baseline per review. Pending UA and CT AP. Plan to likely dc home after CT AP.  CTAP with 34mm left stone at UV junction. No significant hydronephrosis. Creatinine at baseline. UA negative for infection however with blood. Pain controlled in ED. Will dc home with strainer and Urology follow up if pain uncontrolled with home pain meds. Tolerating PO intake in Ed without difficulty.  The patient has been appropriately medically screened and/or stabilized in the ED. I have low suspicion for any other emergent medical condition which would require further screening, evaluation or treatment in the ED or require inpatient management.  Patient is hemodynamically stable and in no acute distress.  Patient able to ambulate in department prior to ED.  Evaluation does not show acute pathology that would require ongoing or additional emergent interventions while in the emergency department or further inpatient treatment.  I have discussed the diagnosis with the patient and answered all questions.  Pain is been managed while in the emergency department and patient has no further complaints prior to discharge.  Patient is comfortable with plan discussed in room and is stable for discharge at this time.  I have discussed strict return precautions for returning to the emergency department.  Patient was encouraged to follow-up with PCP/specialist refer to at discharge.     Lorelle Macaluso A, PA-C 05/20/19 1607    Ezequiel Essex, MD 05/20/19 1702

## 2019-05-20 NOTE — ED Provider Notes (Signed)
[facility] DEPT Provider Note   CSN: [id number] Arrival date & time: 05/20/19  1325     History Chief Complaint  Patient presents with  . Abdominal Pain    Chelsea Cherry is a 49 y.o. female with a past medical history of IBS, fibromyalgia, anxiety presenting to the ED with a chief complaint of abdominal pain.  Approximately 6 hours ago had acute onset of left-sided abdominal pain.  She states that the pain is different than her usual IBS.  Has been having bloody diarrhea for the past 2 months which she states is her baseline.  She denies history of similar symptoms in the past.  States that the pain in her back has been constant and that it will intermittently radiate to to the left lower quadrant and feels sharp.  Reports associated nausea but denies any vomiting.  Reports pressure sensation when urinating and dysuria as well.  Denies any vaginal discharge or abnormal vaginal bleeding. She has a history of a ruptured cyst on the R side in the past but she is unsure if this feels similar. Denies any chest pain, shortness of breath, history of kidney stones or pyelonephritis, fever.  HPI     Past Medical History:  Diagnosis Date  . Anxiety   . Chronic headaches   . Colon polyps   . Depression   . Fibromyalgia   . Hyperlipidemia   . IBS (irritable bowel syndrome)   . Mitral valve prolapse   . SVT (supraventricular tachycardia) ([place])   . UTI (urinary tract infection)   . Vitamin D deficiency     Patient Active Problem List   Diagnosis Date Noted  . Bipolar II disorder ([place])   . MDD (major depressive disorder), recurrent severe, without psychosis ([place]) 04/04/2019  . MDD (major depressive disorder), recurrent episode, moderate ([place]) 04/17/2017    Past Surgical History:  Procedure Laterality Date  . abdominal laproscopic surgery    . BREAST BIOPSY Right   . BREAST EXCISIONAL BIOPSY Right    x3  . BREAST EXCISIONAL BIOPSY Left   . FOOT  SURGERY    . TUBAL LIGATION    . WISDOM TOOTH EXTRACTION       OB History   No obstetric history on file.     Family History  Problem Relation Age of Onset  . Fibromyalgia Mother   . Depression Mother   . Anxiety disorder Mother   . Heart disease Mother   . Alcohol abuse Maternal Uncle   . Alcohol abuse Maternal Grandmother   . Ovarian cancer Maternal Grandmother   . Bladder Cancer Maternal Grandmother     Social History   Tobacco Use  . Smoking status: Former Smoker    Quit date: 01/03/2007    Years since quitting: 12.3  . Smokeless tobacco: Never Used  Substance Use Topics  . Alcohol use: Yes    Alcohol/week: 1.0 standard drinks    Types: 1 Standard drinks or equivalent per week    Comment: 3-4 week  . Drug use: Never    Home Medications Prior to Admission medications   Medication Sig Start Date End Date Taking? Authorizing Provider  aspirin EC 81 MG tablet Take 81 mg by mouth daily.    [provider]  celecoxib (CELEBREX) 200 MG capsule Take 200 mg by mouth daily.    [provider]  dexlansoprazole (DEXILANT) 60 MG capsule Take 60 mg by mouth daily.    [provider]  DULoxetine (  CYMBALTA) 60 MG capsule Take 2 capsules (120 mg total) by mouth daily. Patient taking differently: Take 120 mg by mouth at bedtime.  09/14/17   Norman Clay, MD  FEROSUL 325 (65 Fe) MG tablet Take 325 mg by mouth 2 (two) times a week.  03/17/19   [provider]  gabapentin (NEURONTIN) 300 MG capsule Take 300-600 mg by mouth 2 (two) times daily. Take 300 mg in the morning and Take 600 mg by mouth at bedtime    [provider]  HYDROcodone-acetaminophen (NORCO/VICODIN) 5-325 MG tablet Take 1 tablet by mouth every 6 (six) hours as needed for moderate pain.    [provider]  levothyroxine (SYNTHROID, LEVOTHROID) 50 MCG tablet Take 50 mcg by mouth daily before breakfast.    [provider]  loratadine (CLARITIN) 10 MG tablet  Take 10 mg by mouth daily.    [provider]  metFORMIN (GLUCOPHAGE) 500 MG tablet Take 1,000 mg by mouth every evening.     [provider]  metoprolol succinate (TOPROL-XL) 50 MG 24 hr tablet Take 75 mg by mouth daily. 02/21/19   [provider]  mirabegron ER (MYRBETRIQ) 50 MG TB24 tablet Take 50 mg by mouth daily.    [provider]  NARCAN 4 MG/0.1ML LIQD nasal spray kit Place 1 spray into the nose once.  07/09/18   [provider]  pravastatin (PRAVACHOL) 80 MG tablet Take 80 mg by mouth at bedtime. 01/24/19   [provider]  traZODone (DESYREL) 50 MG tablet Take 50 mg by mouth at bedtime.     [provider]  VASCEPA 0.5 g CAPS Take 2 capsules by mouth 2 (two) times daily. 03/18/19   [provider]    Allergies    Patient has no known allergies.  Review of Systems   Review of Systems  Constitutional: Negative for appetite change, chills and fever.  HENT: Negative for ear pain, rhinorrhea, sneezing and sore throat.   Eyes: Negative for photophobia and visual disturbance.  Respiratory: Negative for cough, chest tightness, shortness of breath and wheezing.   Cardiovascular: Negative for chest pain and palpitations.  Gastrointestinal: Positive for abdominal pain, diarrhea and nausea. Negative for blood in stool, constipation and vomiting.  Genitourinary: Positive for dysuria and flank pain. Negative for hematuria and urgency.  Musculoskeletal: Negative for myalgias.  Skin: Negative for rash.  Neurological: Negative for dizziness, weakness and light-headedness.    Physical Exam Updated Vital Signs BP 122/65   Pulse 69   Temp 98.2 F (36.8 C) (Oral)   Resp 15   SpO2 98%   Physical Exam Vitals and nursing note reviewed.  Constitutional:      General: She is not in acute distress.    Appearance: She is well-developed.  HENT:     Head: Normocephalic and atraumatic.     Nose: Nose normal.  Eyes:      General: No scleral icterus.       Left eye: No discharge.     Conjunctiva/sclera: Conjunctivae normal.  Cardiovascular:     Rate and Rhythm: Normal rate and regular rhythm.     Heart sounds: Normal heart sounds. No murmur. No friction rub. No gallop.   Pulmonary:     Effort: Pulmonary effort is normal. No respiratory distress.     Breath sounds: Normal breath sounds.  Abdominal:     General: Bowel sounds are normal. There is no distension.     Palpations: Abdomen is soft.  Tenderness: There is abdominal tenderness in the left lower quadrant. There is left CVA tenderness. There is no guarding.  Musculoskeletal:        General: Normal range of motion.     Cervical back: Normal range of motion and neck supple.  Skin:    General: Skin is warm and dry.     Findings: No rash.  Neurological:     Mental Status: She is alert.     Motor: No abnormal muscle tone.     Coordination: Coordination normal.     ED Results / Procedures / Treatments   Labs (all labs ordered are listed, but only abnormal results are displayed) Labs Reviewed  COMPREHENSIVE METABOLIC PANEL - Abnormal; Notable for the following components:      Result Value   CO2 20 (*)    Glucose, Bld 115 (*)    All other components within normal limits  CBC WITH DIFFERENTIAL/PLATELET - Abnormal; Notable for the following components:   WBC 11.1 (*)    Neutro Abs 8.7 (*)    All other components within normal limits  CBG MONITORING, ED - Abnormal; Notable for the following components:   Glucose-Capillary 100 (*)    All other components within normal limits  URINE CULTURE  LIPASE, BLOOD  URINALYSIS, ROUTINE W REFLEX MICROSCOPIC  I-STAT BETA HCG BLOOD, ED ([facility], [facility], AP ONLY)    EKG None  Radiology No results found.  Procedures Procedures (including critical care time)  Medications Ordered in ED Medications  morphine 4 MG/ML injection 4 mg (4 mg Intravenous Given 05/20/19 1431)    ED Course  I have reviewed the  triage vital signs and the nursing notes.  Pertinent labs & imaging results that were available during my care of the patient were reviewed by me and considered in my medical decision making (see chart for details).    MDM Rules/Calculators/A&P                      [phone] F presents to ED for left-sided abdominal pain since this morning.  Reports associated nausea but denies any vomiting.  Diarrhea has been bloody for the past 2 months which patient states that this is typical for her.  She was in her usual state of health when she went to bed last night. Pain was gradual in onset, begins in the L back and radiates down to the left flank and left lower quadrant.  She does have dysuria and pressure with urination.  Abdomen is tender in the left flank area.  She is concerned about abdominal pathology as opposed to pelvic pathology.  Lab work significant for leukocytosis of 11, lipase unremarkable, CMP unremarkable.  hCG is negative.  Urinalysis pending.  Will obtain CT of the abdomen pelvis to evaluate for cause of symptoms. Care handed off to oncoming provider pending disposition and remainder of labwork.  Final Clinical Impression(s) / ED Diagnoses Final diagnoses:  None    Rx / DC Orders ED Discharge Orders    None     Portions of this note were generated with Dragon dictation software. Dictation errors may occur despite best attempts at proofreading.    Delia Heady, PA-C 05/20/19 1525    Maudie Flakes, MD 05/24/19 [phone]

## 2019-05-20 NOTE — ED Triage Notes (Signed)
Patient brought in by [facility], left side abd pain since 0830 today. Patient has ibs but states this pain is different than normal. Patient reports diarrhea x2 months, and feels dizzy.

## 2019-05-20 NOTE — ED Notes (Signed)
Urine culture sent down with UA. 

## 2019-05-20 NOTE — ED Notes (Signed)
Patient transported to CT at this time. 

## 2019-05-20 NOTE — Discharge Instructions (Signed)
Take you home pain meds and the Flomax sent to the pharmacy. Increase fluid intake at home. If you develop severe pain, fever seek reevaluation in ED.

## 2019-05-21 LAB — URINE CULTURE: Culture: NO GROWTH

## 2019-05-24 ENCOUNTER — Ambulatory Visit: Payer: Medicaid Other | Admitting: Physical Therapy

## 2019-05-26 ENCOUNTER — Ambulatory Visit: Payer: Medicaid Other | Admitting: Physical Therapy

## 2019-05-31 ENCOUNTER — Encounter: Payer: Medicaid Other | Admitting: Physical Therapy

## 2019-06-02 ENCOUNTER — Ambulatory Visit: Payer: Medicaid Other | Admitting: Physical Therapy

## 2019-06-02 ENCOUNTER — Telehealth: Payer: Self-pay | Admitting: Physical Therapy

## 2019-06-02 NOTE — Telephone Encounter (Signed)
Left message regarding no show today and advised of next appointment time.  Equan Cogbill C. Keegan Bensch PT, DPT 06/02/19 11:51 AM

## 2019-06-07 ENCOUNTER — Other Ambulatory Visit: Payer: Self-pay

## 2019-06-07 ENCOUNTER — Encounter: Payer: Self-pay | Admitting: Physical Therapy

## 2019-06-07 ENCOUNTER — Ambulatory Visit: Payer: Medicaid Other | Attending: Nurse Practitioner | Admitting: Physical Therapy

## 2019-06-07 DIAGNOSIS — G8929 Other chronic pain: Secondary | ICD-10-CM | POA: Diagnosis present

## 2019-06-07 DIAGNOSIS — M5441 Lumbago with sciatica, right side: Secondary | ICD-10-CM | POA: Diagnosis present

## 2019-06-07 DIAGNOSIS — M25552 Pain in left hip: Secondary | ICD-10-CM | POA: Diagnosis present

## 2019-06-07 DIAGNOSIS — M5442 Lumbago with sciatica, left side: Secondary | ICD-10-CM | POA: Insufficient documentation

## 2019-06-07 DIAGNOSIS — M6281 Muscle weakness (generalized): Secondary | ICD-10-CM

## 2019-06-07 NOTE — Therapy (Signed)
[place], [place], [address] Phone: [phone]   Fax:  [phone]  Physical Therapy Treatment/Discharge  Patient Details  Name: Vidalia Serpas MRN: [id number] Date of Birth: 1970-05-15 Referring Provider (PT): Simona Huh, NP   Encounter Date: 06/07/2019  PT End of Session - 06/07/19 1335    Visit Number  23    Date for PT Re-Evaluation  07/01/19    Authorization Type  MCD    Authorization Time Period  1/27-3/2    Authorization - Visit Number  4    Authorization - Number of Visits  10    PT Start Time  1331    PT Stop Time  1412    PT Time Calculation (min)  41 min    Activity Tolerance  Patient tolerated treatment well    Behavior During Therapy  [facility] for tasks assessed/performed       Past Medical History:  Diagnosis Date  . Anxiety   . Chronic headaches   . Colon polyps   . Depression   . Fibromyalgia   . Hyperlipidemia   . IBS (irritable bowel syndrome)   . Mitral valve prolapse   . SVT (supraventricular tachycardia) ([place])   . UTI (urinary tract infection)   . Vitamin D deficiency     Past Surgical History:  Procedure Laterality Date  . abdominal laproscopic surgery    . BREAST BIOPSY Right   . BREAST EXCISIONAL BIOPSY Right    x3  . BREAST EXCISIONAL BIOPSY Left   . FOOT SURGERY    . TUBAL LIGATION    . WISDOM TOOTH EXTRACTION      There were no vitals filed for this visit.  Subjective Assessment - 06/07/19 1333    Subjective  I've got my folder with all my papers.    Diagnostic tests  Lumbar MRI 05/2018 "Trace anterolisthesis of L4 on L5 with associated advanced facetarthropathy, with resultant mild bilateral lateral recess stenosis. Facet disease at this level could contribute to lower back pain. Additional DDD and facet hypertrophy at L3-4 and L5-S1 without stenosis.    Patient Stated Goals  get the pain more managed    Currently in Pain?  Yes    Pain Score  5     Pain Location  Back    Pain Orientation  Right    Pain Descriptors / Indicators  Sore    Aggravating Factors   fibro flares    Pain Relieving Factors  stretches         [facility] PT Assessment - 06/07/19 0001      Assessment   Medical Diagnosis  lumbar pain with radiculopathy    Referring Provider (PT)  Simona Huh, NP                   [facility] Adult PT Treatment/Exercise - 06/07/19 0001      Exercises   Other Exercises   with physioball standing at wall options for strengthening and balance      Lumbar Exercises: Aerobic   Nustep  5 min L6 UE & LE             PT Education - 06/07/19 1335    Education Details  importance of continued HEP, walking program    Person(s) Educated  Patient    Methods  Explanation    Comprehension  Verbalized understanding       PT Short Term Goals - 04/25/19 1105  PT SHORT TERM GOAL #1   Title  Pt will be I and compliant with HEP 4 weeks 12/03/18    Baseline  pt reporting she has been doing all her stretches issued at her last visit    Time  4    Period  Weeks    Status  Achieved        PT Long Term Goals - 06/07/19 1340      PT LONG TERM GOAL #1   Title  Pt will reduce pain overall 50% (target for all LTG 12 weeks 02/07/2019)    Status  Achieved      PT LONG TERM GOAL #2   Title  Pt will improve ROM to [facility] to improve mobility.    Baseline  gross ROM WFL    Status  Achieved      PT LONG TERM GOAL #3   Title  Pt will improve strength to at least 5-/5 MMT to improve function    Baseline  5/5 MMT    Status  Achieved      PT LONG TERM GOAL #4   Title  Pt will improve overall activity tolerance with standing and walking to at least 30 minutes for ADL's    Status  Achieved      PT LONG TERM GOAL #5   Title  Able to ambulate without trendelenburg    Status  Achieved            Plan - 06/07/19 1513    Clinical Impression Statement  Pt has met all of her goals at this time and has made significant  improvement in functional ability. She has a very comprehensive HEP and verbalized understanding of progressions and modifications. Encouraged her to contact us with any further questions.    PT Treatment/Interventions  Aquatic Therapy;Electrical Stimulation;Moist Heat;Traction;Ultrasound;Therapeutic activities;Therapeutic exercise;Neuromuscular re-education;Manual techniques;Passive range of motion;Dry needling;Spinal Manipulations;Joint Manipulations;Taping    PT Home Exercise Plan  Access Code: [email or web address], verbally added hip adduction with ball, tub exercises., physioball exercises    Consulted and Agree with Plan of Care  Patient       Patient will benefit from skilled therapeutic intervention in order to improve the following deficits and impairments:  Decreased activity tolerance, Decreased endurance, Decreased range of motion, Decreased strength, Difficulty walking, Postural dysfunction, Pain, Increased muscle spasms  Visit Diagnosis: Chronic bilateral low back pain with bilateral sciatica  Muscle weakness (generalized)  Left hip pain     Problem List Patient Active Problem List   Diagnosis Date Noted  . Bipolar II disorder ([place])   . MDD (major depressive disorder), recurrent severe, without psychosis ([place]) 04/04/2019  . MDD (major depressive disorder), recurrent episode, moderate ([place]) 04/17/2017   PHYSICAL THERAPY DISCHARGE SUMMARY  Visits from Start of Care: 23  Current functional level related to goals / functional outcomes: See above   Remaining deficits: See above   Education / Equipment: Anatomy of condition, POC, HEP, exercise form/rationale  Plan: Patient agrees to discharge.  Patient goals were met. Patient is being discharged due to meeting the stated rehab goals.  ?????     Ellie Spickler C. Noralee Dutko PT, DPT 06/07/19 3:16 PM   [facility] [address] [place], [place], [address] Phone: [phone]   Fax:   [phone]  Name: Makinzie Considine MRN: [id number] Date of Birth: July 17, 1970

## 2019-07-29 ENCOUNTER — Encounter (HOSPITAL_COMMUNITY): Payer: Self-pay

## 2019-07-29 ENCOUNTER — Ambulatory Visit (HOSPITAL_COMMUNITY)
Admission: EM | Admit: 2019-07-29 | Discharge: 2019-07-29 | Disposition: A | Discharge status: Home / Self Care | Payer: Medicaid Other

## 2019-07-29 ENCOUNTER — Other Ambulatory Visit: Payer: Self-pay

## 2019-07-29 DIAGNOSIS — S161XXA Strain of muscle, fascia and tendon at neck level, initial encounter: Secondary | ICD-10-CM | POA: Diagnosis not present

## 2019-07-29 DIAGNOSIS — M7918 Myalgia, other site: Secondary | ICD-10-CM

## 2019-07-29 NOTE — Discharge Instructions (Signed)
Continue your pain management routine for your fibromyalgia -You may consider taking 10 mg of the Flexeril in the evening for a few days  Soreness is expected for the next 7 to 10 days.  Other soreness may develop.  If there is significant increase or a significant change in the type of pain you are experiencing please return follow-up with your primary care.  If you develop numbness, tingling or weakness in your extremities please return or report to the emergency department.
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
## 2019-09-15 ENCOUNTER — Encounter: Payer: Self-pay | Admitting: General Practice

## 2019-11-14 DIAGNOSIS — Z0271 Encounter for disability determination: Secondary | ICD-10-CM

## 2019-11-24 ENCOUNTER — Other Ambulatory Visit: Payer: Self-pay | Admitting: Nurse Practitioner

## 2019-11-24 DIAGNOSIS — Z1231 Encounter for screening mammogram for malignant neoplasm of breast: Secondary | ICD-10-CM

## 2019-12-09 ENCOUNTER — Ambulatory Visit
Admission: RE | Admit: 2019-12-09 | Discharge: 2019-12-09 | Disposition: A | Discharge status: Home / Self Care | Payer: Medicaid Other | Source: Ambulatory Visit | Attending: Nurse Practitioner | Admitting: Nurse Practitioner

## 2019-12-09 ENCOUNTER — Other Ambulatory Visit: Payer: Self-pay

## 2019-12-09 DIAGNOSIS — Z1231 Encounter for screening mammogram for malignant neoplasm of breast: Secondary | ICD-10-CM

## 2020-02-27 ENCOUNTER — Other Ambulatory Visit: Payer: Self-pay

## 2020-02-27 ENCOUNTER — Ambulatory Visit: Payer: Medicaid Other | Admitting: Advanced Practice Midwife

## 2020-02-27 ENCOUNTER — Other Ambulatory Visit (HOSPITAL_COMMUNITY)
Admission: RE | Admit: 2020-02-27 | Discharge: 2020-02-27 | Disposition: A | Discharge status: Home / Self Care | Payer: Medicaid Other | Source: Ambulatory Visit | Attending: Advanced Practice Midwife | Admitting: Advanced Practice Midwife

## 2020-02-27 ENCOUNTER — Encounter: Payer: Self-pay | Admitting: Advanced Practice Midwife

## 2020-02-27 VITALS — BP 100/62 | HR 77 | Ht 67.0 in | Wt 211.0 lb

## 2020-02-27 DIAGNOSIS — I471 Supraventricular tachycardia: Secondary | ICD-10-CM

## 2020-02-27 DIAGNOSIS — N941 Unspecified dyspareunia: Secondary | ICD-10-CM | POA: Diagnosis not present

## 2020-02-27 DIAGNOSIS — Z01419 Encounter for gynecological examination (general) (routine) without abnormal findings: Secondary | ICD-10-CM | POA: Insufficient documentation

## 2020-02-27 DIAGNOSIS — R102 Pelvic and perineal pain: Secondary | ICD-10-CM | POA: Diagnosis not present

## 2020-02-27 DIAGNOSIS — G8929 Other chronic pain: Secondary | ICD-10-CM

## 2020-02-27 DIAGNOSIS — N952 Postmenopausal atrophic vaginitis: Secondary | ICD-10-CM

## 2020-02-27 DIAGNOSIS — M797 Fibromyalgia: Secondary | ICD-10-CM

## 2020-02-27 NOTE — Progress Notes (Addendum)
New GYN presents for AEX/PAP.  Declined STD screening.   Mammogram 12/09/2019  PHQ-9=0

## 2020-02-27 NOTE — Progress Notes (Signed)
Subjective:     Chelsea Cherry is a 48 y.o. female here at [facility] for a routine exam.  Current complaints: painful intercourse.  Personal health questionnaire reviewed: yes.  Do you have a primary care provider? yes Do you feel safe at home? yes    Office Visit from 02/27/2020 in [place]  PHQ-2 Total Score 0      Risk factors for chronic health problems: Smoking: does not smoke tobacco products, patient states she smokes CBD in the day and THC vape pen at night Alchohol/how much: 2 cans of light beer per week Pt BMI: Body mass index is 33.05 kg/m.   Gynecologic History No LMP recorded. Patient is postmenopausal. Contraception: none Last Pap: unknown but patient states she had 2 after her LEEP in 2014. Results were: normal Last mammogram: 12/09/2019. Results were: normal  Obstetric History OB History  Gravida Para Term Preterm AB Living  3         3  SAB TAB Ectopic Multiple Live Births               # Outcome Date GA Lbr Len/2nd Weight Sex Delivery Anes PTL Lv  3 Gravida           2 Gravida           1 Gravida              The following portions of the patient's history were reviewed and updated as appropriate: allergies, current medications, past family history, past medical history, past social history, past surgical history and problem list.  Review of Systems Pertinent items are noted in HPI. Pertinent items noted in HPI and remainder of comprehensive ROS otherwise negative.    Objective:   BP 100/62   Pulse 77   Ht 5\' 7"  (1.702 m)   Wt 95.7 kg   BMI 33.05 kg/m  VS reviewed, nursing note reviewed,  Constitutional: well developed, well nourished, no distress HEENT: normocephalic CV: normal rate, history of MVP and SVT, currently wearing a heart monitor and has an ablation scheduled in a month Pulm/chest wall: normal effort Breast Exam: Deferred with low risks and shared decision making, discussed recommendation to start  mammogram between 40-50 yo/ exam performed: right breast normal without mass, skin or nipple changes or axillary nodes, left breast normal without mass, skin or nipple changes or axillary nodes Abdomen: soft Neuro: alert and oriented x 3 Skin: warm, dry Psych: affect normal Pelvic exam:  Performed: Cervix pink, visually closed, without lesion, scant white creamy discharge, vaginal walls and external genitalia normal Bimanual exam: Cervix 0/long/high, firm, anterior, neg CMT, uterus nontender, nonenlarged, adnexa without tenderness, enlargement, or mass       Assessment/Plan:   1. Well woman exam with routine gynecological exam  - Cytology - PAP( [place]) - Bimanual exam   2. Chronic pelvic pain in female - US PELVIC COMPLETE WITH TRANSVAGINAL; Future - Follow up with MD after [place] due to cardiovascular condition  3. Dyspareunia in female - US PELVIC COMPLETE WITH TRANSVAGINAL; Future - Follow up with MD after [place] due to cardiovascular condition  4. Vaginitis, atrophic   5. Vaginal atrophy   6. SVT (supraventricular tachycardia) ([place]) --Has an ablation scheduled, sees cardiologist  7. Fibromyalgia --Sees PCP     Follow up in: 3 months or as needed.   Valli Glance, Student-MidWife 5:04 PM  CNM attestation:  I have seen and examined this patient; I agree  with above documentation in the midwife student's note.   Chelsea Cherry is a 49 y.o. G3P0 in the [place] office for well woman exam. Pt without menses, postmenopausal, having painful intercourse in last year or 2.  See problem list below.   Patient Active Problem List   Diagnosis Date Noted  . SVT (supraventricular tachycardia) ([place]) 02/27/2020  . Vaginitis, atrophic 02/27/2020  . Dyspareunia in female 02/27/2020  . Chronic pelvic pain in female 02/27/2020  . Well woman exam with routine gynecological exam 02/27/2020  . Fibromyalgia 02/27/2020  . Bipolar II disorder ([place])   . MDD (major depressive  disorder), recurrent severe, without psychosis ([place]) 04/04/2019  . MDD (major depressive disorder), recurrent episode, moderate ([place]) 04/17/2017     ROS, labs, PMH reviewed  PE: BP 100/62   Pulse 77   Ht 5\' 7"  (1.702 m)   Wt 211 lb (95.7 kg)   BMI 33.05 kg/m  VS reviewed, nursing note reviewed,  Constitutional: well developed, well nourished, no distress HEENT: normocephalic CV: normal rate Pulm/chest wall: normal effort Abdomen: soft, nontender Neuro: alert and oriented x 3 Skin: warm, dry Psych: affect normal    Assessment/Plan:   1. Well woman exam with routine gynecological exam - Cytology - PAP( [place])  2. Chronic pelvic pain in female --Likely atrophic vaginitis, will follow up with [place]  - US PELVIC COMPLETE WITH TRANSVAGINAL; Future  3. Dyspareunia in female --Discussed nonhormonal treatments including vaginal moisturizer, vaginal lubricants.  - US PELVIC COMPLETE WITH TRANSVAGINAL; Future  4. Vaginal atrophy --Visible signs of skin changes due to menopause on exam, may be contributing to pt pain.  Pt may be candidate for hormone replacement, especially topical treatment, but given cardiac concerns, will do outpatient [place], have pt complete treatment and follow up with cardiologist, and have pt see MD for visit in 3 months in our office to further discuss options.  5. SVT (supraventricular tachycardia) ([place]) --Has cardiologist, ablation planned in 1 month  6. Fibromyalgia     Fatima Blank, CNM 12:14 PM

## 2020-03-05 LAB — CYTOLOGY - PAP
Comment: NEGATIVE
Diagnosis: NEGATIVE
High risk HPV: NEGATIVE

## 2020-03-12 ENCOUNTER — Encounter: Payer: Self-pay | Admitting: *Deleted

## 2020-03-12 ENCOUNTER — Other Ambulatory Visit: Payer: Self-pay

## 2020-03-12 ENCOUNTER — Encounter: Payer: Self-pay | Admitting: Internal Medicine

## 2020-03-12 ENCOUNTER — Ambulatory Visit: Payer: Medicaid Other | Admitting: Internal Medicine

## 2020-03-12 VITALS — BP 118/74 | HR 64 | Ht 67.0 in | Wt 214.2 lb

## 2020-03-12 DIAGNOSIS — I471 Supraventricular tachycardia: Secondary | ICD-10-CM | POA: Diagnosis not present

## 2020-03-12 NOTE — Progress Notes (Signed)
Electrophysiology Office Note   Date:  03/12/2020   ID:  Araya Roel, DOB Mar 23, 1971, MRN [id number]  PCP:  Simona Huh, NP  Cardiologist:  [facility] Primary Electrophysiologist: Thompson Grayer, MD    CC: SVT   History of Present Illness: Chelsea Cherry is a 49 y.o. female who presents today for electrophysiology evaluation.   She is referred by Roque Cash for EP consultation regarding palpitations. She reports for years that she has had abrupt onset/offset of tachypalpitations.  These episodes are worse with anxiety, exercising or with certain bending maneuvers. She has occasionally found vagal maneuvers to be beneficial.  Episodes are short but occur most days. She wore an event monitor but just took this off this past Friday.  Results are pending.  Today, she denies symptoms of chest pain, shortness of breath, orthopnea, PND, lower extremity edema, claudication, dizziness, presyncope, syncope, bleeding, or neurologic sequela. The patient is tolerating medications without difficulties and is otherwise without complaint today.    Past Medical History:  Diagnosis Date  . Anxiety   . Chronic headaches   . Colon polyps   . Depression   . Fibroid   . Fibromyalgia   . Hyperlipidemia   . IBS (irritable bowel syndrome)   . Mitral valve prolapse   . SVT (supraventricular tachycardia) ([place])   . UTI (urinary tract infection)   . Vaginal Pap smear, abnormal   . Vitamin D deficiency    Past Surgical History:  Procedure Laterality Date  . abdominal laproscopic surgery    . BREAST BIOPSY Right   . BREAST EXCISIONAL BIOPSY Right    x3  . BREAST EXCISIONAL BIOPSY Left   . CERVICAL BIOPSY  W/ LOOP ELECTRODE EXCISION    . FOOT SURGERY    . thumb surgery    . TUBAL LIGATION    . WISDOM TOOTH EXTRACTION       Current Outpatient Medications  Medication Sig Dispense Refill  . aspirin EC 325 MG tablet Take 325 mg by mouth daily.    . busPIRone (BUSPAR) 10 MG tablet  Take 10 mg by mouth 3 (three) times daily as needed for pain.    . cyclobenzaprine (FLEXERIL) 5 MG tablet Take 5-10 mg by mouth 3 (three) times daily as needed for muscle pain.    Marland Kitchen dexlansoprazole (DEXILANT) 60 MG capsule Take 60 mg by mouth daily.    Mariane Baumgarten Sodium (COLACE PO) Take 1 tablet by mouth daily.    . DULoxetine (CYMBALTA) 60 MG capsule Take 2 capsules (120 mg total) by mouth daily. 180 capsule 0  . ergocalciferol (VITAMIN D2) 1.25 MG (50000 UT) capsule Take by mouth.    . fenofibrate 54 MG tablet Take 54 mg by mouth every evening.    Marland Kitchen FEROSUL 325 (65 Fe) MG tablet Take 325 mg by mouth 2 (two) times a week.     . gabapentin (NEURONTIN) 300 MG capsule Take 300-600 mg by mouth 2 (two) times daily. 300 mg in the morning, 300 mg at noon, and Take 600 mg by mouth at bedtime    . glucosamine-chondroitin 500-400 MG tablet Take 1 tablet by mouth 2 (two) times daily.    Marland Kitchen HYDROcodone-acetaminophen (NORCO/VICODIN) 5-325 MG tablet Take 1-2 tablets by mouth See admin instructions. Take 2 tabs in AM, 2 in the afternoon & 1 at bedtime.    Marland Kitchen levothyroxine (SYNTHROID, LEVOTHROID) 50 MCG tablet Take 50 mcg by mouth daily before breakfast.    . loratadine (CLARITIN) 10  MG tablet Take 10 mg by mouth daily.    . metFORMIN (GLUCOPHAGE) 500 MG tablet Take 1,000 mg by mouth every evening.     . metoprolol succinate (TOPROL-XL) 50 MG 24 hr tablet Take 75 mg by mouth daily.    . mirabegron ER (MYRBETRIQ) 50 MG TB24 tablet Take 50 mg by mouth daily.    Marland Kitchen NARCAN 4 MG/0.1ML LIQD nasal spray kit Place 1 spray into the nose once.     . pravastatin (PRAVACHOL) 80 MG tablet Take 80 mg by mouth at bedtime.    . SUMAtriptan (IMITREX) 50 MG tablet Take 1 tablet by mouth as needed for headache.    . traZODone (DESYREL) 100 MG tablet Take 100 mg by mouth at bedtime.    . Turmeric 450 MG CAPS Take 1 tablet by mouth daily.    Marland Kitchen VASCEPA 0.5 g CAPS Take 2 capsules by mouth 2 (two) times daily.     No current  facility-administered medications for this visit.    Allergies:   Patient has no known allergies.   Social History:  The patient  reports that she quit smoking about 13 years ago. She has never used smokeless tobacco. She reports current alcohol use of about 1.0 standard drink of alcohol per week. She reports current drug use. Drug: Marijuana.   Family History:  The patient's  family history includes Alcohol abuse in her maternal grandmother and maternal uncle; Anxiety disorder in her mother; Bladder Cancer in her maternal grandmother; Depression in her mother; Fibromyalgia in her mother; Heart disease in her mother; Ovarian cancer in her maternal grandmother.    ROS:  Please see the history of present illness.   All other systems are personally reviewed and negative.    PHYSICAL EXAM: VS:  BP 118/74   Pulse 64   Ht _0  (1.702 m)   Wt 214 lb 3.2 oz (97.2 kg)   SpO2 95%   BMI 33.55 kg/m  , BMI Body mass index is 33.55 kg/m. GEN: Well nourished, well developed, in no acute distress HEENT: normal Neck: no JVD, carotid bruits, or masses Cardiac: RRR; no murmurs, rubs, or gallops,no edema  Respiratory:  clear to auscultation bilaterally, normal work of breathing GI: soft, nontender, nondistended, + BS MS: no deformity or atrophy Skin: warm and dry  Neuro:  Strength and sensation are intact Psych: euthymic mood, full affect  EKG:  EKG is ordered today. The ekg ordered today is personally reviewed and shows sinus rhythm 64 bpm, PR 146 msec, QRS 84 msec, QTc 406 msec    Recent Labs: 04/04/2019: TSH 0.508 05/20/2019: ALT 23; BUN 10; Creatinine, Ser 0.85; Hemoglobin 13.5; Platelets 241; Potassium 3.5; Sodium 139  personally reviewed   Lipid Panel     Component Value Date/Time   CHOL 203 (H) 04/04/2019 1708   TRIG 213 (H) 04/04/2019 1708   HDL 75 04/04/2019 1708   CHOLHDL 2.7 04/04/2019 1708   VLDL 43 (H) 04/04/2019 1708   LDLCALC 85 04/04/2019 1708   personally reviewed    Wt Readings from Last 3 Encounters:  03/12/20 214 lb 3.2 oz (97.2 kg)  02/27/20 211 lb (95.7 kg)  04/04/19 194 lb (88 kg)      Other studies personally reviewed: Additional studies/ records that were reviewed today include: 90 pages from [place] of the above records today demonstrates: as above   ASSESSMENT AND PLAN:  1.  Tachycardia She has symptoms of tachycardia and has previously been told  that she had "SVT".  She wore an event monitor but just took this off this past Friday.  Results are pending.  She has also had a prior echo that I do not have access to today. Therapeutic strategies for supraventricular tachycardia including medicine and ablation were discussed in detail with the patient today. Risk, benefits, and alternatives to EP study and radiofrequency ablation were also discussed in detail today. These risks include but are not limited to stroke, bleeding, vascular damage, tamponade, perforation, damage to the heart and other structures, AV block requiring pacemaker, worsening renal function, and death. The patient understands these risk and wishes to proceed.  We will therefore proceed with catheter ablation at the next available time.  Carto, ICE, and anesthesia (MAC) are requested.   Risks, benefits and potential toxicities for medications prescribed and/or refilled reviewed with patient today.      Army Fossa, MD  03/12/2020 9:41 AM     [facility] [address] [place] [address] [phone] (office) [phone] (fax)

## 2020-03-12 NOTE — Patient Instructions (Addendum)
Medication Instructions:  Your physician recommends that you continue on your current medications as directed. Please refer to the Current Medication list given to you today.  *If you need a refill on your cardiac medications before your next appointment, please call your pharmacy*  Lab Work: None ordered.  If you have labs (blood work) drawn today and your tests are completely normal, you will receive your results only by: Marland Kitchen MyChart Message (if you have MyChart) OR . A paper copy in the mail If you have any lab test that is abnormal or we need to change your treatment, we will call you to review the results.  Testing/Procedures: None ordered.  Follow-Up: At [facility], you and your health needs are our priority.  As part of our continuing mission to provide you with exceptional heart care, we have created designated Provider Care Teams.  These Care Teams include your primary Cardiologist (physician) and Advanced Practice Providers (APPs -  Physician Assistants and Nurse Practitioners) who all work together to provide you with the care you need, when you need it.  We recommend signing up for the patient portal called "MyChart".  Sign up information is provided on this After Visit Summary.  MyChart is used to connect with patients for Virtual Visits (Telemedicine).  Patients are able to view lab/test results, encounter notes, upcoming appointments, etc.  Non-urgent messages can be sent to your provider as well.   To learn more about what you can do with MyChart, go to [email or web address].      Other Instructions:

## 2020-03-19 ENCOUNTER — Other Ambulatory Visit: Payer: Self-pay

## 2020-03-19 ENCOUNTER — Ambulatory Visit
Admission: RE | Admit: 2020-03-19 | Discharge: 2020-03-19 | Disposition: A | Discharge status: Home / Self Care | Payer: Medicaid Other | Source: Ambulatory Visit | Attending: Advanced Practice Midwife | Admitting: Advanced Practice Midwife

## 2020-03-19 DIAGNOSIS — N941 Unspecified dyspareunia: Secondary | ICD-10-CM | POA: Diagnosis present

## 2020-03-19 DIAGNOSIS — R102 Pelvic and perineal pain: Secondary | ICD-10-CM | POA: Insufficient documentation

## 2020-03-19 DIAGNOSIS — G8929 Other chronic pain: Secondary | ICD-10-CM | POA: Diagnosis present

## 2020-04-09 ENCOUNTER — Other Ambulatory Visit: Payer: Medicaid Other

## 2020-04-09 ENCOUNTER — Other Ambulatory Visit: Payer: Self-pay

## 2020-04-09 DIAGNOSIS — I471 Supraventricular tachycardia: Secondary | ICD-10-CM

## 2020-04-09 LAB — CBC WITH DIFFERENTIAL/PLATELET
Basophils Absolute: 0 10*3/uL (ref 0.0–0.2)
Basos: 0 %
EOS (ABSOLUTE): 0 10*3/uL (ref 0.0–0.4)
Eos: 1 %
Hematocrit: 38.5 % (ref 34.0–46.6)
Hemoglobin: 13.1 g/dL (ref 11.1–15.9)
Lymphocytes Absolute: 2 10*3/uL (ref 0.7–3.1)
Lymphs: 34 %
MCH: 28.6 pg (ref 26.6–33.0)
MCHC: 34 g/dL (ref 31.5–35.7)
MCV: 84 fL (ref 79–97)
Monocytes Absolute: 0.5 10*3/uL (ref 0.1–0.9)
Monocytes: 8 %
Neutrophils Absolute: 3.3 10*3/uL (ref 1.4–7.0)
Neutrophils: 57 %
Platelets: 221 10*3/uL (ref 150–450)
RBC: 4.58 x10E6/uL (ref 3.77–5.28)
RDW: 13.8 % (ref 11.7–15.4)
WBC: 5.8 10*3/uL (ref 3.4–10.8)

## 2020-04-09 LAB — BASIC METABOLIC PANEL
BUN/Creatinine Ratio: 14 (ref 9–23)
BUN: 12 mg/dL (ref 6–24)
CO2: 25 mmol/L (ref 20–29)
Calcium: 9.8 mg/dL (ref 8.7–10.2)
Chloride: 106 mmol/L (ref 96–106)
Creatinine, Ser: 0.85 mg/dL (ref 0.57–1.00)
GFR calc Af Amer: 94 mL/min/{1.73_m2} (ref >59)
GFR calc non Af Amer: 82 mL/min/{1.73_m2} (ref >59)
Glucose: 111 mg/dL — ABNORMAL HIGH (ref 65–99)
Potassium: 4.4 mmol/L (ref 3.5–5.2)
Sodium: 142 mmol/L (ref 134–144)

## 2020-04-16 ENCOUNTER — Ambulatory Visit (INDEPENDENT_AMBULATORY_CARE_PROVIDER_SITE_OTHER): Payer: Medicaid Other | Admitting: Obstetrics and Gynecology

## 2020-04-16 ENCOUNTER — Encounter: Payer: Self-pay | Admitting: Obstetrics and Gynecology

## 2020-04-16 ENCOUNTER — Other Ambulatory Visit: Payer: Self-pay

## 2020-04-16 VITALS — BP 108/70 | HR 65

## 2020-04-16 DIAGNOSIS — N941 Unspecified dyspareunia: Secondary | ICD-10-CM

## 2020-04-16 NOTE — Progress Notes (Signed)
50 yo presenting for follow up on dyspareunia. Patient reports onset of her symptoms for several years. She feels that is associated with uterine prolapse. She states that the pain is present despite the use of vaginal lubricants. She describes it as a vaginal irritation.   Past Medical History:  Diagnosis Date  . Anxiety   . Chronic headaches   . Colon polyps   . Depression   . Fibroid   . Fibromyalgia   . Hyperlipidemia   . IBS (irritable bowel syndrome)   . Mitral valve prolapse   . SVT (supraventricular tachycardia) ([place])   . UTI (urinary tract infection)   . Vaginal Pap smear, abnormal   . Vitamin D deficiency    Past Surgical History:  Procedure Laterality Date  . abdominal laproscopic surgery    . BREAST BIOPSY Right   . BREAST EXCISIONAL BIOPSY Right    x3  . BREAST EXCISIONAL BIOPSY Left   . CERVICAL BIOPSY  W/ LOOP ELECTRODE EXCISION    . FOOT SURGERY    . thumb surgery    . TUBAL LIGATION    . WISDOM TOOTH EXTRACTION     Family History  Problem Relation Age of Onset  . Fibromyalgia Mother   . Depression Mother   . Anxiety disorder Mother   . Heart disease Mother   . Alcohol abuse Maternal Uncle   . Alcohol abuse Maternal Grandmother   . Ovarian cancer Maternal Grandmother   . Bladder Cancer Maternal Grandmother    Social History   Tobacco Use  . Smoking status: Former Smoker    Quit date: 01/03/2007    Years since quitting: 13.2  . Smokeless tobacco: Never Used  Vaping Use  . Vaping Use: Never used  Substance Use Topics  . Alcohol use: Yes    Alcohol/week: 1.0 standard drink    Types: 1 Standard drinks or equivalent per week    Comment: 1-3 per week  . Drug use: Yes    Types: Marijuana   ROS See pertinent in HPI. All other systems reviewed and negative  GENERAL: Well-developed, well-nourished female in no acute distress.  ABDOMEN: Soft, nontender, nondistended. No organomegaly. PELVIC: Normal external female genitalia. Vagina is pink and  rugated.  Normal discharge. Normal appearing cervix. Uterus is normal in size. No adnexal mass or tenderness. EXTREMITIES: No cyanosis, clubbing, or edema, 2+ distal pulses.  Pelvic ultrasound FINDINGS: Uterus  Measurements: 6.0 x 3.5 x 4.1 cm = volume: 45 mL. Heterogeneous echogenicity. Retroverted. No discrete uterine mass  Endometrium  Thickness: 3 mm.  No endometrial fluid or focal abnormality  Right ovary  Measurements: 2.8 x 0.9 x 1.1 cm = volume: 0.9 mL. Normal morphology without mass  Left ovary  Measurements: 2.4 x 0.8 x 1.1 cm = volume: 1.1 mL. Normal morphology without mass  Other findings  No free pelvic fluid.  No adnexal masses.  IMPRESSION: Normal exam.   Electronically Signed   By: Lavonia Dana M.D.   On: 03/19/2020 13:10  A/P 50 yo with history of dyspareunia - No clear etiology for her pain - Will refer patient to physical therapy for further evaluation - RTC prn

## 2020-04-16 NOTE — Progress Notes (Signed)
In office for follow up, had recent u/s. Pt states she is still having same issues as before.

## 2020-04-30 ENCOUNTER — Other Ambulatory Visit (HOSPITAL_COMMUNITY)
Admission: RE | Admit: 2020-04-30 | Discharge: 2020-04-30 | Disposition: A | Discharge status: Home / Self Care | Payer: Medicaid Other | Source: Ambulatory Visit | Attending: Internal Medicine | Admitting: Internal Medicine

## 2020-04-30 DIAGNOSIS — Z20822 Contact with and (suspected) exposure to covid-19: Secondary | ICD-10-CM | POA: Diagnosis not present

## 2020-04-30 DIAGNOSIS — Z01812 Encounter for preprocedural laboratory examination: Secondary | ICD-10-CM | POA: Diagnosis not present

## 2020-04-30 LAB — SARS CORONAVIRUS 2 (TAT 6-24 HRS): SARS Coronavirus 2: NEGATIVE

## 2020-04-30 NOTE — Anesthesia Preprocedure Evaluation (Addendum)
Anesthesia Evaluation  Patient identified by MRN, date of birth, ID band Patient awake    Reviewed: Allergy & Precautions, NPO status , Patient's Chart, lab work & pertinent test results, reviewed documented beta blocker date and time   Airway Mallampati: I  TM Distance: >3 FB Neck ROM: Full    Dental no notable dental hx. (+) Teeth Intact, Dental Advisory Given   Pulmonary former smoker,    Pulmonary exam normal        Cardiovascular Normal cardiovascular exam+ dysrhythmias Supra Ventricular Tachycardia      Neuro/Psych  Headaches, PSYCHIATRIC DISORDERS Anxiety Depression Bipolar Disorder    GI/Hepatic negative GI ROS, Neg liver ROS,   Endo/Other  Hypothyroidism   Renal/GU   negative genitourinary   Musculoskeletal negative musculoskeletal ROS (+) Fibromyalgia -  Abdominal (+) + obese,   Peds  Hematology   Anesthesia Other Findings   Reproductive/Obstetrics                          Anesthesia Physical Anesthesia Plan  ASA: II  Anesthesia Plan: General   Post-op Pain Management:    Induction: Intravenous  PONV Risk Score and Plan: 3 and Ondansetron and Midazolam  Airway Management Planned: Oral ETT  Additional Equipment: None  Intra-op Plan:   Post-operative Plan: Extubation in OR  Informed Consent: I have reviewed the patients History and Physical, chart, labs and discussed the procedure including the risks, benefits and alternatives for the proposed anesthesia with the patient or authorized representative who has indicated his/her understanding and acceptance.     Dental advisory given  Plan Discussed with: CRNA  Anesthesia Plan Comments:        Anesthesia Quick Evaluation

## 2020-04-30 NOTE — Progress Notes (Signed)
Instructed patient on the following items: °Arrival time 0530 °Nothing to eat or drink after midnight °No meds AM of procedure °Responsible person to drive you home and stay with you for 24 hrs ° ° °   °

## 2020-05-01 ENCOUNTER — Ambulatory Visit (HOSPITAL_COMMUNITY)
Admission: RE | Admit: 2020-05-01 | Discharge: 2020-05-01 | Disposition: A | Discharge status: Home / Self Care | Payer: Medicaid Other | Attending: Internal Medicine | Admitting: Internal Medicine

## 2020-05-01 ENCOUNTER — Ambulatory Visit (HOSPITAL_COMMUNITY): Payer: Medicaid Other | Admitting: Anesthesiology

## 2020-05-01 ENCOUNTER — Ambulatory Visit (HOSPITAL_COMMUNITY)
Admission: RE | Disposition: A | Discharge status: Home / Self Care | Payer: Self-pay | Source: Home / Self Care | Attending: Internal Medicine

## 2020-05-01 ENCOUNTER — Encounter (HOSPITAL_COMMUNITY): Payer: Self-pay | Admitting: Internal Medicine

## 2020-05-01 DIAGNOSIS — Z79890 Hormone replacement therapy (postmenopausal): Secondary | ICD-10-CM | POA: Diagnosis not present

## 2020-05-01 DIAGNOSIS — I471 Supraventricular tachycardia: Secondary | ICD-10-CM | POA: Diagnosis present

## 2020-05-01 DIAGNOSIS — Z7982 Long term (current) use of aspirin: Secondary | ICD-10-CM | POA: Diagnosis not present

## 2020-05-01 DIAGNOSIS — Z79899 Other long term (current) drug therapy: Secondary | ICD-10-CM | POA: Insufficient documentation

## 2020-05-01 DIAGNOSIS — Z7984 Long term (current) use of oral hypoglycemic drugs: Secondary | ICD-10-CM | POA: Insufficient documentation

## 2020-05-01 DIAGNOSIS — Z87891 Personal history of nicotine dependence: Secondary | ICD-10-CM | POA: Diagnosis not present

## 2020-05-01 HISTORY — PX: SVT ABLATION: EP1225

## 2020-05-01 SURGERY — SVT ABLATION
Anesthesia: General

## 2020-05-01 MED ORDER — ONDANSETRON HCL 4 MG/2ML IJ SOLN
INTRAMUSCULAR | Status: DC | PRN
  Administered 2020-05-01: 4 mg via INTRAVENOUS

## 2020-05-01 MED ORDER — BUPIVACAINE HCL (PF) 0.25 % IJ SOLN
INTRAMUSCULAR | Status: AC
  Filled 2020-05-01: qty 30

## 2020-05-01 MED ORDER — SODIUM CHLORIDE 0.9% FLUSH: 3.0000 mL | Freq: Two times a day (BID) | INTRAVENOUS | Status: DC

## 2020-05-01 MED ORDER — SODIUM CHLORIDE 0.9% FLUSH: 3.0000 mL | INTRAVENOUS | Status: DC | PRN

## 2020-05-01 MED ORDER — ACETAMINOPHEN 325 MG PO TABS: 650.0000 mg | ORAL_TABLET | ORAL | Status: DC | PRN

## 2020-05-01 MED ORDER — ISOPROTERENOL HCL 0.2 MG/ML IJ SOLN
INTRAMUSCULAR | Status: AC
  Filled 2020-05-01: qty 5

## 2020-05-01 MED ORDER — SODIUM CHLORIDE 0.9 % IV SOLN: INTRAVENOUS | Status: DC | PRN

## 2020-05-01 MED ORDER — PROPOFOL 500 MG/50ML IV EMUL
INTRAVENOUS | Status: DC | PRN
  Administered 2020-05-01: 25 ug/kg/min via INTRAVENOUS

## 2020-05-01 MED ORDER — HEPARIN (PORCINE) IN NACL 1000-0.9 UT/500ML-% IV SOLN: INTRAVENOUS | Status: DC | PRN

## 2020-05-01 MED ORDER — ISOPROTERENOL HCL 0.2 MG/ML IJ SOLN
INTRAVENOUS | Status: DC | PRN
  Administered 2020-05-01: 4 ug/min via INTRAVENOUS

## 2020-05-01 MED ORDER — SODIUM CHLORIDE 0.9 % IV SOLN: INTRAVENOUS | Status: DC

## 2020-05-01 MED ORDER — DEXMEDETOMIDINE (PRECEDEX) IN NS 20 MCG/5ML (4 MCG/ML) IV SYRINGE
PREFILLED_SYRINGE | INTRAVENOUS | Status: DC | PRN
  Administered 2020-05-01 (×5): 4 ug via INTRAVENOUS

## 2020-05-01 MED ORDER — LIDOCAINE HCL (PF) 1 % IJ SOLN
INTRAMUSCULAR | Status: DC | PRN
  Administered 2020-05-01: 60 mL

## 2020-05-01 MED ORDER — ONDANSETRON HCL 4 MG/2ML IJ SOLN: 4.0000 mg | Freq: Four times a day (QID) | INTRAMUSCULAR | Status: DC | PRN

## 2020-05-01 MED ORDER — MIDAZOLAM HCL 5 MG/5ML IJ SOLN
INTRAMUSCULAR | Status: DC | PRN
  Administered 2020-05-01 (×2): 1 mg via INTRAVENOUS

## 2020-05-01 MED ORDER — FENTANYL CITRATE (PF) 100 MCG/2ML IJ SOLN
INTRAMUSCULAR | Status: DC | PRN
  Administered 2020-05-01 (×2): 50 ug via INTRAVENOUS

## 2020-05-01 MED ORDER — PROPOFOL 10 MG/ML IV BOLUS
INTRAVENOUS | Status: DC | PRN
  Administered 2020-05-01: 20 mg via INTRAVENOUS

## 2020-05-01 MED ORDER — HYDROCODONE-ACETAMINOPHEN 5-325 MG PO TABS: 1.0000 | ORAL_TABLET | ORAL | Status: DC | PRN

## 2020-05-01 MED ORDER — SODIUM CHLORIDE 0.9 % IV SOLN: 250.0000 mL | INTRAVENOUS | Status: DC | PRN

## 2020-05-01 SURGICAL SUPPLY — 13 items
BAG SNAP BAND KOVER 36X36 (MISCELLANEOUS) ×2 IMPLANT
CATH EZ STEER NAV 4MM D-F CUR (ABLATOR) ×2 IMPLANT
CATH JOSEPH QUAD ALLRED 6F REP (CATHETERS) ×4 IMPLANT
CATH WEB BI DIR CSDF CRV REPRO (CATHETERS) ×2 IMPLANT
CLOSURE PERCLOSE PROSTYLE (VASCULAR PRODUCTS) ×6 IMPLANT
MAT PREVALON FULL STRYKER (MISCELLANEOUS) ×2 IMPLANT
PACK EP LATEX FREE (CUSTOM PROCEDURE TRAY) ×2
PACK EP LF (CUSTOM PROCEDURE TRAY) ×1 IMPLANT
PAD PRO RADIOLUCENT 2001M-C (PAD) ×2 IMPLANT
PATCH CARTO3 (PAD) ×2 IMPLANT
SHEATH PINNACLE 7F 10CM (SHEATH) ×4 IMPLANT
SHEATH PINNACLE 8F 10CM (SHEATH) ×2 IMPLANT
SHEATH PROBE COVER 6X72 (BAG) ×2 IMPLANT

## 2020-05-01 NOTE — Anesthesia Procedure Notes (Signed)
Procedure Name: MAC Date/Time: 05/01/2020 7:30 AM Performed by: Harden Mo, CRNA Pre-anesthesia Checklist: Patient identified, Emergency Drugs available, Suction available and Patient being monitored Patient Re-evaluated:Patient Re-evaluated prior to induction Oxygen Delivery Method: Simple face mask Preoxygenation: Pre-oxygenation with 100% oxygen Induction Type: IV induction Placement Confirmation: positive ETCO2 and breath sounds checked- equal and bilateral Dental Injury: Teeth and Oropharynx as per pre-operative assessment

## 2020-05-01 NOTE — Progress Notes (Signed)
Discharge instructions reviewed with patient and family. Verbalized understanding. 

## 2020-05-01 NOTE — H&P (Signed)
CC: SVT   History of Present Illness: Chelsea Cherry is a 50 y.o. female who presents today for electrophysiology study and possible ablation of tachycardia.   She is referred by Roque Cash for EP consultation regarding palpitations. She reports for years that she has had abrupt onset/offset of tachypalpitations.  These episodes are worse with anxiety, exercising or with certain bending maneuvers. She has occasionally found vagal maneuvers to be beneficial.  Episodes are short but occur most days. She wore an event monitor but just took this off this past Friday.  Results have not been sent to me to review.  Today, she denies symptoms of chest pain, shortness of breath, orthopnea, PND, lower extremity edema, claudication, dizziness, presyncope, syncope, bleeding, or neurologic sequela. The patient is tolerating medications without difficulties and is otherwise without complaint today.        Past Medical History:  Diagnosis Date  . Anxiety   . Chronic headaches   . Colon polyps   . Depression   . Fibroid   . Fibromyalgia   . Hyperlipidemia   . IBS (irritable bowel syndrome)   . Mitral valve prolapse   . SVT (supraventricular tachycardia) ([place])   . UTI (urinary tract infection)   . Vaginal Pap smear, abnormal   . Vitamin D deficiency         Past Surgical History:  Procedure Laterality Date  . abdominal laproscopic surgery    . BREAST BIOPSY Right   . BREAST EXCISIONAL BIOPSY Right    x3  . BREAST EXCISIONAL BIOPSY Left   . CERVICAL BIOPSY  W/ LOOP ELECTRODE EXCISION    . FOOT SURGERY    . thumb surgery    . TUBAL LIGATION    . WISDOM TOOTH EXTRACTION             Current Outpatient Medications  Medication Sig Dispense Refill  . aspirin EC 325 MG tablet Take 325 mg by mouth daily.    . busPIRone (BUSPAR) 10 MG tablet Take 10 mg by mouth 3 (three) times daily as needed for pain.    . cyclobenzaprine (FLEXERIL) 5 MG tablet Take  5-10 mg by mouth 3 (three) times daily as needed for muscle pain.    Marland Kitchen dexlansoprazole (DEXILANT) 60 MG capsule Take 60 mg by mouth daily.    Mariane Baumgarten Sodium (COLACE PO) Take 1 tablet by mouth daily.    . DULoxetine (CYMBALTA) 60 MG capsule Take 2 capsules (120 mg total) by mouth daily. 180 capsule 0  . ergocalciferol (VITAMIN D2) 1.25 MG (50000 UT) capsule Take by mouth.    . fenofibrate 54 MG tablet Take 54 mg by mouth every evening.    Marland Kitchen FEROSUL 325 (65 Fe) MG tablet Take 325 mg by mouth 2 (two) times a week.     . gabapentin (NEURONTIN) 300 MG capsule Take 300-600 mg by mouth 2 (two) times daily. 300 mg in the morning, 300 mg at noon, and Take 600 mg by mouth at bedtime    . glucosamine-chondroitin 500-400 MG tablet Take 1 tablet by mouth 2 (two) times daily.    Marland Kitchen HYDROcodone-acetaminophen (NORCO/VICODIN) 5-325 MG tablet Take 1-2 tablets by mouth See admin instructions. Take 2 tabs in AM, 2 in the afternoon & 1 at bedtime.    Marland Kitchen levothyroxine (SYNTHROID, LEVOTHROID) 50 MCG tablet Take 50 mcg by mouth daily before breakfast.    . loratadine (CLARITIN) 10 MG tablet Take 10 mg by mouth daily.    . metFORMIN (  GLUCOPHAGE) 500 MG tablet Take 1,000 mg by mouth every evening.     . metoprolol succinate (TOPROL-XL) 50 MG 24 hr tablet Take 75 mg by mouth daily.    . mirabegron ER (MYRBETRIQ) 50 MG TB24 tablet Take 50 mg by mouth daily.    Marland Kitchen NARCAN 4 MG/0.1ML LIQD nasal spray kit Place 1 spray into the nose once.     . pravastatin (PRAVACHOL) 80 MG tablet Take 80 mg by mouth at bedtime.    . SUMAtriptan (IMITREX) 50 MG tablet Take 1 tablet by mouth as needed for headache.    . traZODone (DESYREL) 100 MG tablet Take 100 mg by mouth at bedtime.    . Turmeric 450 MG CAPS Take 1 tablet by mouth daily.    Marland Kitchen VASCEPA 0.5 g CAPS Take 2 capsules by mouth 2 (two) times daily.     No current facility-administered medications for this visit.    Allergies:   Patient  has no known allergies.   Social History:  The patient  reports that she quit smoking about 13 years ago. She has never used smokeless tobacco. She reports current alcohol use of about 1.0 standard drink of alcohol per week. She reports current drug use. Drug: Marijuana.   Family History:  The patient's  family history includes Alcohol abuse in her maternal grandmother and maternal uncle; Anxiety disorder in her mother; Bladder Cancer in her maternal grandmother; Depression in her mother; Fibromyalgia in her mother; Heart disease in her mother; Ovarian cancer in her maternal grandmother.    ROS:  Please see the history of present illness.   All other systems are personally reviewed and negative.    PHYSICAL EXAM: Vitals:   05/01/20 0540  BP: (!) 114/57  Pulse: 73  Temp: 98.1 F (36.7 C)  SpO2: 98%   d, in no acute distress HEENT: normal Neck: no JVD, carotid bruits, or masses Cardiac: RRR; no murmurs, rubs, or gallops,no edema  Respiratory:  clear to auscultation bilaterally, normal work of breathing GI: soft, nontender, nondistended, + BS MS: no deformity or atrophy Skin: warm and dry  Neuro:  Strength and sensation are intact Psych: euthymic mood, full affect      Other studies personally reviewed: Additional studies/ records that were reviewed today include: 90 pages from [place] of the above records today demonstrates: as above   ASSESSMENT AND PLAN:  1.  Tachycardia She has symptoms of tachycardia and has previously been told that she had "SVT".  She wore an event monitor but results were not sent to me.  I have reached out again today for results.   Therapeutic strategies for tachycardia including medicine and ablation were discussed in detail with the patient today. Risk, benefits, and alternatives to EP study and radiofrequency ablation were also discussed in detail today. These risks include but are not limited to stroke, bleeding, vascular  damage, tamponade, perforation, damage to the heart and other structures, AV block requiring pacemaker, worsening renal function, and death. The patient understands these risk and wishes to proceed.    Thompson Grayer MD, [place] 05/01/2020 6:53 AM

## 2020-05-01 NOTE — Anesthesia Postprocedure Evaluation (Signed)
Anesthesia Post Note  Patient: Chelsea Cherry  Procedure(s) Performed: SVT ABLATION (N/A )     Patient location during evaluation: Cath Lab Anesthesia Type: General Level of consciousness: awake and sedated Pain management: pain level controlled Vital Signs Assessment: post-procedure vital signs reviewed and stable Respiratory status: spontaneous breathing Cardiovascular status: stable Postop Assessment: no apparent nausea or vomiting Anesthetic complications: no   No complications documented.  Last Vitals:  Vitals:   05/01/20 0950 05/01/20 0955  BP: 94/60 107/67  Pulse: 86 89  Resp: (!) 9 (!) 9  Temp:    SpO2: 96% 95%    Last Pain:  Vitals:   05/01/20 0945  TempSrc: (P) Temporal  PainSc: (P) 0-No pain                 John F Salome Arnt

## 2020-05-01 NOTE — Transfer of Care (Signed)
Immediate Anesthesia Transfer of Care Note  Patient: Chelsea Cherry  Procedure(s) Performed: SVT ABLATION (N/A )  Patient Location: Cath Lab  Anesthesia Type:MAC  Level of Consciousness: awake and drowsy  Airway & Oxygen Therapy: Patient Spontanous Breathing and Patient connected to nasal cannula oxygen  Post-op Assessment: Report given to RN and Post -op Vital signs reviewed and stable  Post vital signs: Reviewed and stable  Last Vitals:  Vitals Value Taken Time  BP    Temp    Pulse    Resp    SpO2      Last Pain:  Vitals:   05/01/20 0624  TempSrc:   PainSc: 2       Patients Stated Pain Goal: 3 (88/87/57 9728)  Complications: No complications documented.

## 2020-05-01 NOTE — Discharge Instructions (Signed)

## 2020-05-02 ENCOUNTER — Encounter (HOSPITAL_COMMUNITY): Payer: Self-pay | Admitting: Internal Medicine

## 2020-05-02 MED FILL — Bupivacaine HCl Preservative Free (PF) Inj 0.25%: INTRAMUSCULAR | Qty: 30 | Status: AC

## 2020-06-04 ENCOUNTER — Ambulatory Visit (INDEPENDENT_AMBULATORY_CARE_PROVIDER_SITE_OTHER): Payer: Medicaid Other | Admitting: Internal Medicine

## 2020-06-04 ENCOUNTER — Encounter: Payer: Self-pay | Admitting: Internal Medicine

## 2020-06-04 ENCOUNTER — Other Ambulatory Visit: Payer: Self-pay

## 2020-06-04 VITALS — BP 102/74 | HR 72 | Ht 67.0 in | Wt 217.0 lb

## 2020-06-04 DIAGNOSIS — I471 Supraventricular tachycardia: Secondary | ICD-10-CM

## 2020-06-04 MED ORDER — METOPROLOL SUCCINATE ER 50 MG PO TB24: 50.0000 mg | ORAL_TABLET | Freq: Every day | ORAL | 3 refills | Status: DC

## 2020-06-04 NOTE — Patient Instructions (Addendum)
Medication Instructions:  Reduce your Metoprolol succinate to 50 mg daily Your physician recommends that you continue on your current medications as directed. Please refer to the Current Medication list given to you today.  Labwork: None ordered.  Testing/Procedures: None ordered.  Follow-Up: Your physician wants you to follow-up in: as needed with Dr. Rayann Heman.   Any Other Special Instructions Will Be Listed Below (If Applicable).  If you need a refill on your cardiac medications before your next appointment, please call your pharmacy.

## 2020-06-04 NOTE — Progress Notes (Signed)
PCP: Simona Huh, NP   Primary EP: Dr Rayann Heman  Phylliss Strege is a 50 y.o. female who presents today for routine electrophysiology followup.  Since her EPS, the patient reports doing very well. No further SVT Today, she denies symptoms of palpitations, chest pain, shortness of breath,  lower extremity edema, dizziness, presyncope, or syncope.  The patient is otherwise without complaint today.   Past Medical History:  Diagnosis Date  . Anxiety   . Chronic headaches   . Colon polyps   . Depression   . Fibroid   . Fibromyalgia   . Hyperlipidemia   . IBS (irritable bowel syndrome)   . Mitral valve prolapse   . SVT (supraventricular tachycardia) ([place])   . UTI (urinary tract infection)   . Vaginal Pap smear, abnormal   . Vitamin D deficiency    Past Surgical History:  Procedure Laterality Date  . abdominal laproscopic surgery    . BREAST BIOPSY Right   . BREAST EXCISIONAL BIOPSY Right    x3  . BREAST EXCISIONAL BIOPSY Left   . CERVICAL BIOPSY  W/ LOOP ELECTRODE EXCISION    . FOOT SURGERY    . SVT ABLATION N/A 05/01/2020   Procedure: SVT ABLATION;  Surgeon: Thompson Grayer, MD;  Location: [facility];  Service: Cardiovascular;  Laterality: N/A;  . thumb surgery    . TUBAL LIGATION    . WISDOM TOOTH EXTRACTION      ROS- all systems are reviewed and negatives except as per HPI above  Current Outpatient Medications  Medication Sig Dispense Refill  . aspirin EC 325 MG tablet Take 325 mg by mouth daily.    . busPIRone (BUSPAR) 10 MG tablet Take 10 mg by mouth 3 (three) times daily.    . cyclobenzaprine (FLEXERIL) 5 MG tablet Take 5-10 mg by mouth See admin instructions. Take 10 mg in the morning, 5 mg at afternoon, and 10 mg at bedtime    . dexlansoprazole (DEXILANT) 60 MG capsule Take 60 mg by mouth daily.    Mariane Baumgarten Sodium (COLACE PO) Take 250 mg by mouth daily.    . DULoxetine (CYMBALTA) 60 MG capsule Take 2 capsules (120 mg total) by mouth daily. 180 capsule 0   . ergocalciferol (VITAMIN D2) 1.25 MG (50000 UT) capsule Take 50,000 Units by mouth every Sunday.    . fenofibrate 54 MG tablet Take 54 mg by mouth at bedtime.    . FEROSUL 325 (65 Fe) MG tablet Take 325 mg by mouth 2 (two) times a week.     . fluticasone (FLONASE) 50 MCG/ACT nasal spray Place 1 spray into both nostrils daily.    Marland Kitchen gabapentin (NEURONTIN) 300 MG capsule Take 300-600 mg by mouth 2 (two) times daily. 300 mg in the morning, 300 mg at afternoon, and Take 600 mg by mouth at bedtime    . GLUCOSAMINE-CHONDROITIN PO Take 1 tablet by mouth 2 (two) times daily. 1500 mg /1103 mg    . HYDROcodone-acetaminophen (NORCO) 7.5-325 MG tablet Take 1-2 tablets by mouth See admin instructions. Take 2 tabs in AM, 6 hours later take 2 tablets and 1 tablet at bedtime    . icosapent Ethyl (VASCEPA) 1 g capsule Take 2 g by mouth 2 (two) times daily.    Marland Kitchen levothyroxine (SYNTHROID, LEVOTHROID) 50 MCG tablet Take 50 mcg by mouth daily before breakfast.    . LINZESS 72 MCG capsule Take 72-144 mcg by mouth daily.    Marland Kitchen loratadine (CLARITIN) 10  MG tablet Take 10 mg by mouth daily.    . metFORMIN (GLUCOPHAGE) 500 MG tablet Take 1,000 mg by mouth at bedtime.    . metoprolol succinate (TOPROL-XL) 50 MG 24 hr tablet Take 75 mg by mouth daily.    . mirabegron ER (MYRBETRIQ) 50 MG TB24 tablet Take 50 mg by mouth daily.    . Multiple Vitamins-Minerals (MULTIVITAMIN WITH MINERALS) tablet Take 1 tablet by mouth daily.    Marland Kitchen NARCAN 4 MG/0.1ML LIQD nasal spray kit Place 1 spray into the nose once.     . pravastatin (PRAVACHOL) 80 MG tablet Take 80 mg by mouth at bedtime.    . SUMAtriptan (IMITREX) 50 MG tablet Take 50 mg by mouth every 2 (two) hours as needed for headache or migraine.    . topiramate (TOPAMAX) 100 MG tablet Take 100 mg by mouth at bedtime.    . traZODone (DESYREL) 100 MG tablet Take 100 mg by mouth at bedtime.     No current facility-administered medications for this visit.    Physical Exam: Vitals:    06/04/20 0909  BP: 102/74  Pulse: 72  SpO2: 97%  Weight: 217 lb (98.4 kg)  Height: 5' 7"  (1.702 m)    GEN- The patient is overweight appearing, alert and oriented x 3 today.   Head- normocephalic, atraumatic Eyes-  Sclera clear, conjunctiva pink Ears- hearing intact Oropharynx- clear Lungs- Clear to ausculation bilaterally, normal work of breathing Heart- Regular rate and rhythm, no murmurs, rubs or gallops, PMI not laterally displaced GI- soft, NT, ND, + BS Extremities- no clubbing, cyanosis, or edema  Wt Readings from Last 3 Encounters:  06/04/20 217 lb (98.4 kg)  05/01/20 215 lb (97.5 kg)  03/12/20 214 lb 3.2 oz (97.2 kg)    EKG tracing ordered today is personally reviewed and shows sinus  Assessment and Plan:  1. 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 HL Stable No change required today I have advised that she discuss reducing ASA to 826mdaily with her PCP  Risks, benefits and potential toxicities for medications prescribed and/or refilled reviewed with patient today.   Return as needed  JaThompson GrayerD, FACreekwood Surgery Center LP/28/2022 9:34 AM

## 2020-06-22 ENCOUNTER — Ambulatory Visit: Payer: Medicaid Other | Admitting: Physical Therapy

## 2020-10-31 ENCOUNTER — Emergency Department (HOSPITAL_COMMUNITY)
Admission: EM | Admit: 2020-10-31 | Discharge: 2020-11-01 | Disposition: A | Discharge status: Home / Self Care | Payer: Medicaid Other | Attending: Emergency Medicine | Admitting: Emergency Medicine

## 2020-10-31 ENCOUNTER — Encounter (HOSPITAL_COMMUNITY): Payer: Self-pay | Admitting: Emergency Medicine

## 2020-10-31 DIAGNOSIS — R112 Nausea with vomiting, unspecified: Secondary | ICD-10-CM | POA: Diagnosis present

## 2020-10-31 DIAGNOSIS — Z5321 Procedure and treatment not carried out due to patient leaving prior to being seen by health care provider: Secondary | ICD-10-CM | POA: Diagnosis not present

## 2020-10-31 DIAGNOSIS — U071 COVID-19: Secondary | ICD-10-CM | POA: Insufficient documentation

## 2020-10-31 LAB — COMPREHENSIVE METABOLIC PANEL
ALT: 30 U/L (ref 0–44)
AST: 36 U/L (ref 15–41)
Albumin: 4.2 g/dL (ref 3.5–5.0)
Alkaline Phosphatase: 47 U/L (ref 38–126)
Anion gap: 11 (ref 5–15)
BUN: 10 mg/dL (ref 6–20)
CO2: 20 mmol/L — ABNORMAL LOW (ref 22–32)
Calcium: 9.2 mg/dL (ref 8.9–10.3)
Chloride: 101 mmol/L (ref 98–111)
Creatinine, Ser: 1.08 mg/dL — ABNORMAL HIGH (ref 0.44–1.00)
GFR, Estimated: >60 mL/min (ref >60)
Glucose, Bld: 93 mg/dL (ref 70–99)
Potassium: 3.2 mmol/L — ABNORMAL LOW (ref 3.5–5.1)
Sodium: 132 mmol/L — ABNORMAL LOW (ref 135–145)
Total Bilirubin: 0.8 mg/dL (ref 0.3–1.2)
Total Protein: 7.6 g/dL (ref 6.5–8.1)

## 2020-10-31 LAB — LIPASE, BLOOD: Lipase: 78 U/L — ABNORMAL HIGH (ref 11–51)

## 2020-10-31 LAB — CBC WITH DIFFERENTIAL/PLATELET
Abs Immature Granulocytes: 0.01 10*3/uL (ref 0.00–0.07)
Basophils Absolute: 0 10*3/uL (ref 0.0–0.1)
Basophils Relative: 0 %
Eosinophils Absolute: 0 10*3/uL (ref 0.0–0.5)
Eosinophils Relative: 0 %
HCT: 46.2 % — ABNORMAL HIGH (ref 36.0–46.0)
Hemoglobin: 15.7 g/dL — ABNORMAL HIGH (ref 12.0–15.0)
Immature Granulocytes: 0 %
Lymphocytes Relative: 27 %
Lymphs Abs: 1 10*3/uL (ref 0.7–4.0)
MCH: 29 pg (ref 26.0–34.0)
MCHC: 34 g/dL (ref 30.0–36.0)
MCV: 85.2 fL (ref 80.0–100.0)
Monocytes Absolute: 0.7 10*3/uL (ref 0.1–1.0)
Monocytes Relative: 18 %
Neutro Abs: 2.2 10*3/uL (ref 1.7–7.7)
Neutrophils Relative %: 55 %
Platelets: 178 10*3/uL (ref 150–400)
RBC: 5.42 MIL/uL — ABNORMAL HIGH (ref 3.87–5.11)
RDW: 12.6 % (ref 11.5–15.5)
WBC: 3.9 10*3/uL — ABNORMAL LOW (ref 4.0–10.5)
nRBC: 0 % (ref 0.0–0.2)

## 2020-10-31 MED ORDER — ONDANSETRON 4 MG PO TBDP: 4.0000 mg | ORAL_TABLET | Freq: Once | ORAL | Status: DC

## 2020-10-31 NOTE — ED Notes (Signed)
Called for repeat VS x3, no response. 

## 2020-10-31 NOTE — ED Provider Notes (Signed)
Emergency Medicine Provider Triage Evaluation Note  Chelsea Cherry , a 50 y.o. female  was evaluated in triage.  Pt complains of patient was diagnosed with COVID on Monday, states that she is not feeling well, been unable to tolerate p.o., states that she has not been able to take any of her medications, she feels generally fatigued and tired.  She was vaccinated for COVID-19, not immunocompromise, denies any alleviating factors..  Review of Systems  Positive: Fatigue, nausea vomiting Negative: Chest, shortness of breath  Physical Exam  BP 115/84 (BP Location: Right Arm)   Pulse 93   Temp 98.6 F (37 C) (Oral)   Resp 20   SpO2 99%  Gen:   Awake, no distress   Resp:  Normal effort  MSK:   Moves extremities without difficulty  Other:    Medical Decision Making  Medically screening exam initiated at 6:52 PM.  Appropriate orders placed.  Dennisha Mcham was informed that the remainder of the evaluation will be completed by another provider, this initial triage assessment does not replace that evaluation, and the importance of remaining in the ED until their evaluation is complete.  Presents with nausea vomiting fatigue, patiently further work-up.   Marcello Fennel, PA-C 10/31/20 1853    Pattricia Boss, MD 11/04/20 [phone]

## 2020-10-31 NOTE — ED Triage Notes (Signed)
Pt covid + on Monday. Weakness, n/v since yesterday. Unable to take daily meds.

## 2021-02-28 IMAGING — CT CT ABD-PELV W/ CM
2 of 5 series · 16 of 46 positions shown, 18 images · IV contrast (OMNIPAQUE 300)
Comparison: None.

CLINICAL DATA: LEFT-sided abdominal pain. Pain since [DATE] this
morning.

EXAM:
CT ABDOMEN AND PELVIS WITH CONTRAST
TECHNIQUE: Multidetector CT imaging of the abdomen and pelvis was performed
using the standard protocol following bolus administration of
intravenous contrast.
CONTRAST:  100mL OMNIPAQUE IOHEXOL 300 MG/ML  SOLN

[Series 2: axial st · axial · 0.78mm/px · z∈[-514,-114]mm · 13 of 94 slices shown, 15 images]
[im 7/94  soft-tissue]
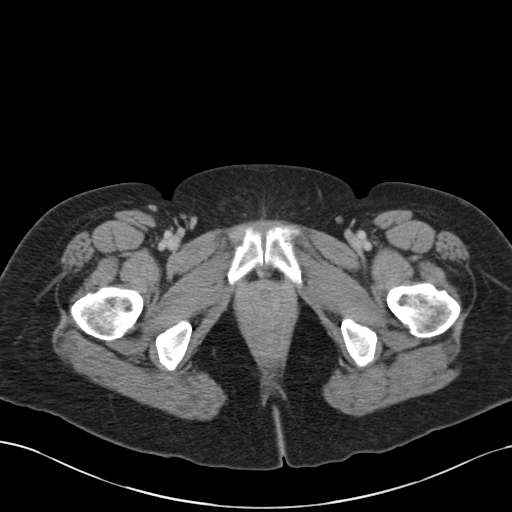
[im 7/94  bone]
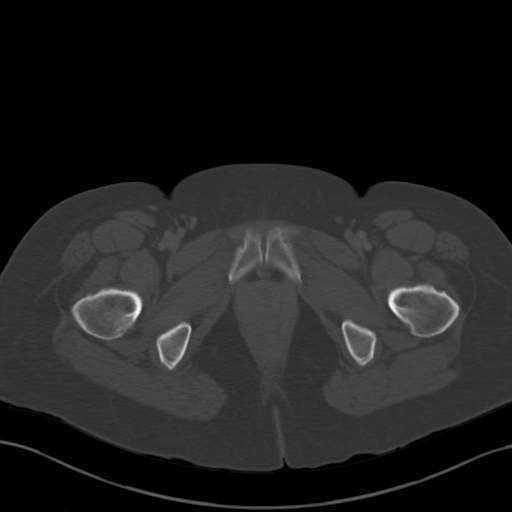
[im 14/94  soft-tissue]
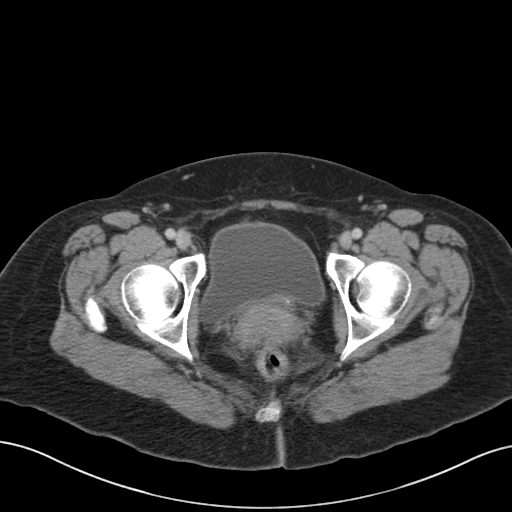
[im 20/94  soft-tissue]
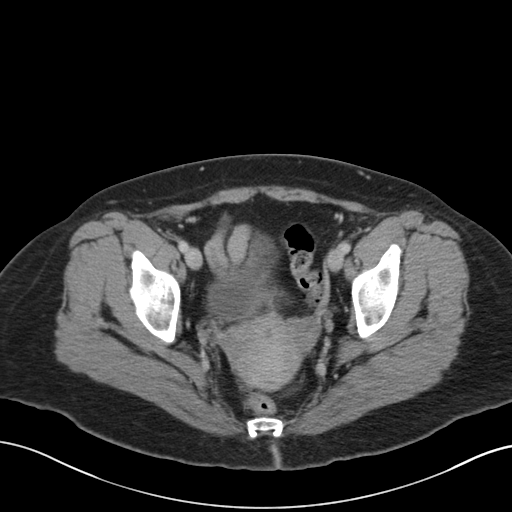
[im 27/94  soft-tissue]
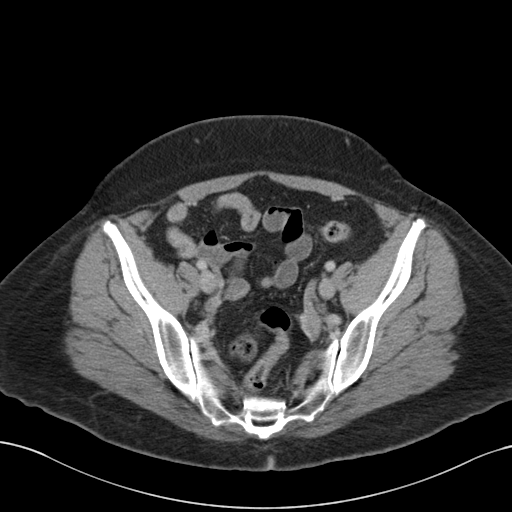
[im 34/94  soft-tissue]
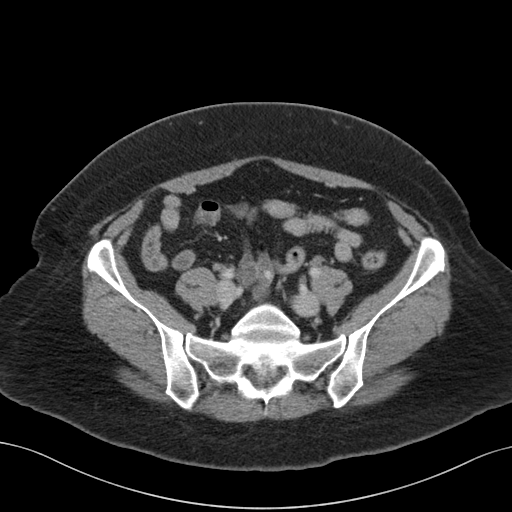
[im 40/94  soft-tissue]
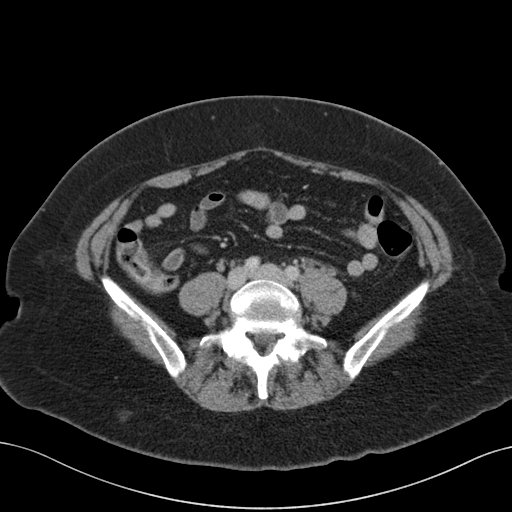
[im 47/94  soft-tissue]
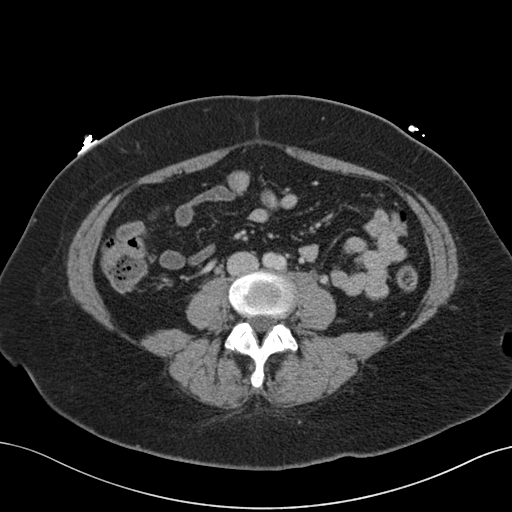
[im 54/94  soft-tissue]
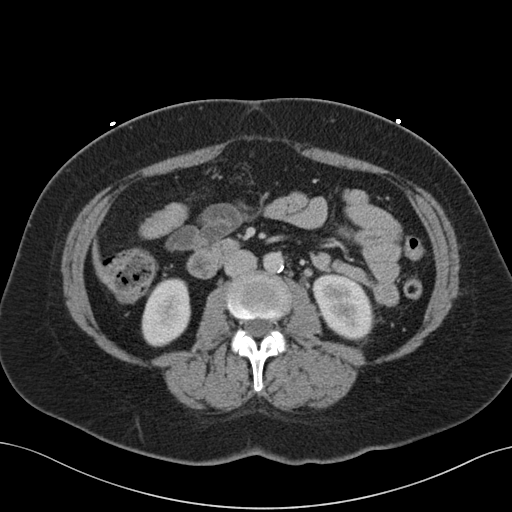
[im 60/94  soft-tissue]
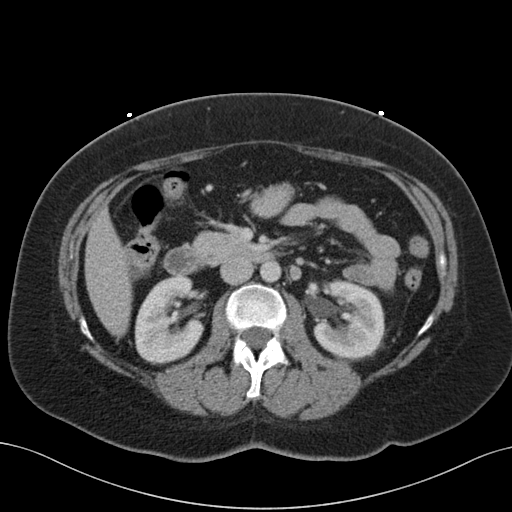
[im 60/94  bone]
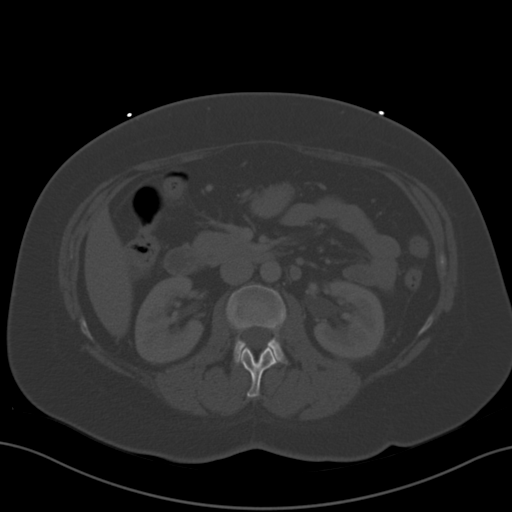
[im 67/94  soft-tissue]
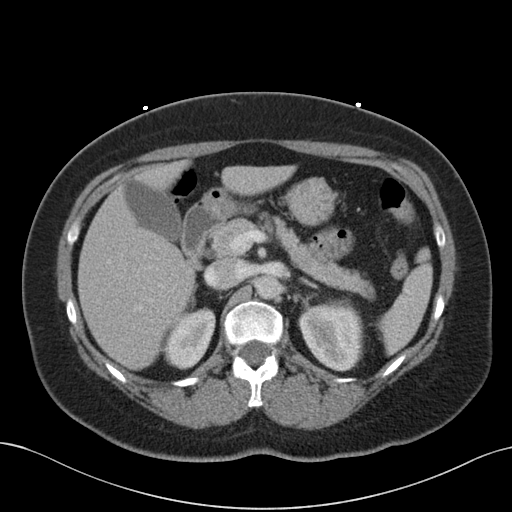
[im 74/94  soft-tissue]
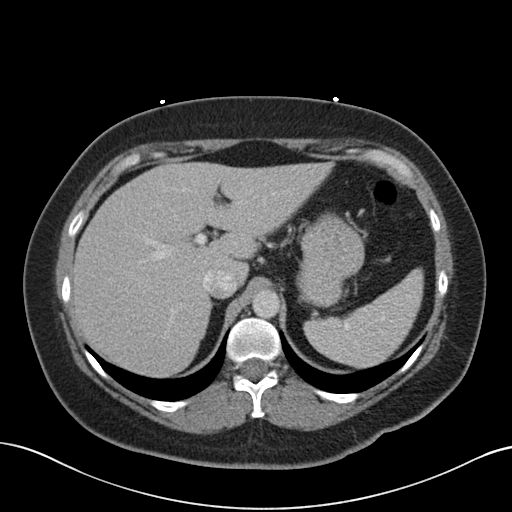
[im 80/94  soft-tissue]
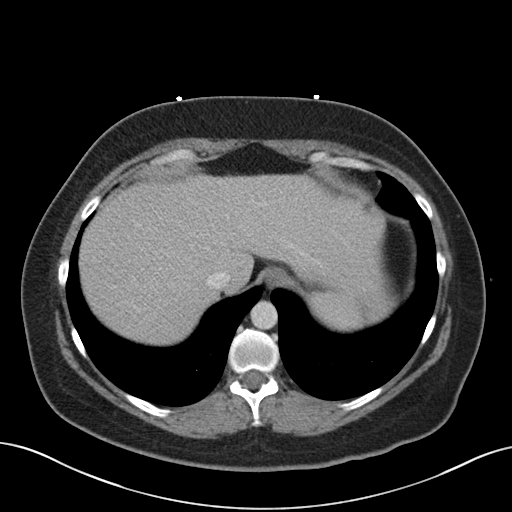
[im 87/94  soft-tissue]
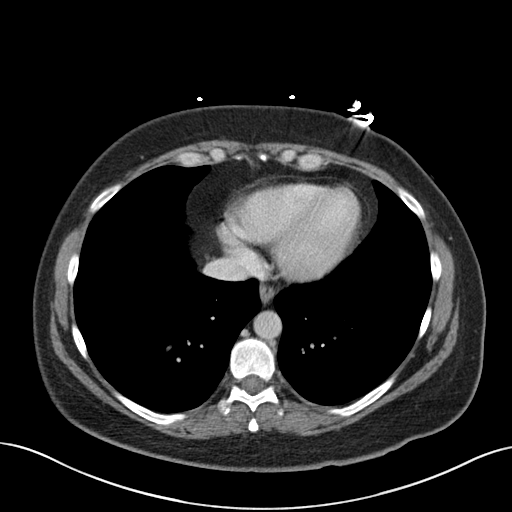

[Series 5: coronal st · coronal · 0.82mm/px · 3 of 166 slices shown]
[im 56/166  soft-tissue]
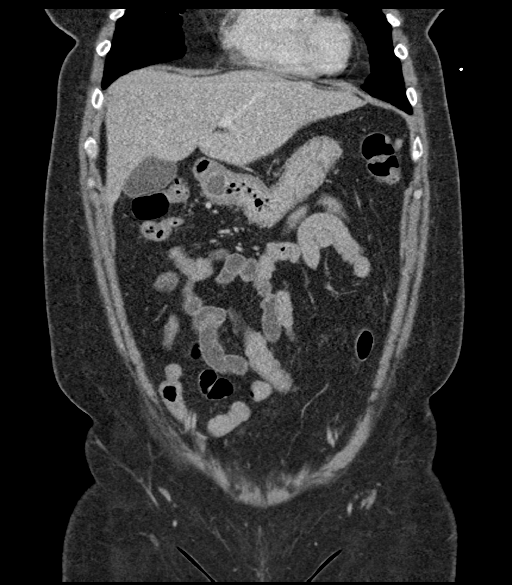
[im 74/166  soft-tissue]
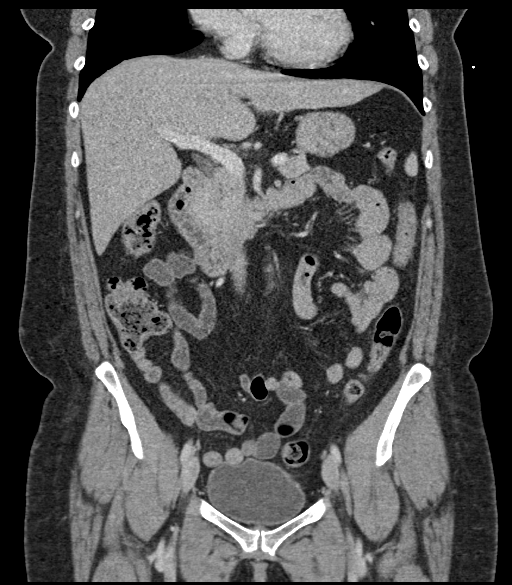
[im 92/166  soft-tissue]
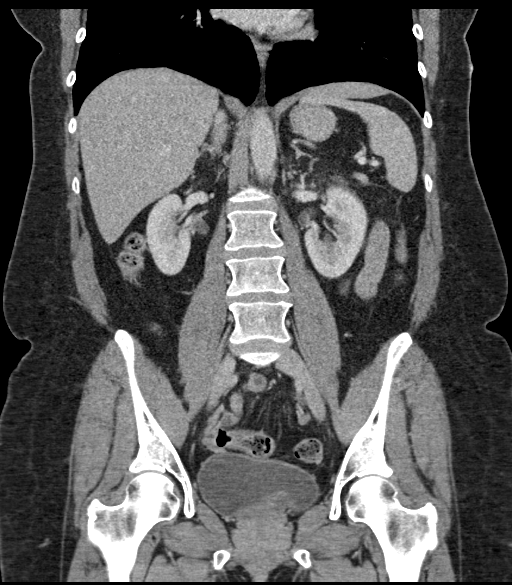

[16 of 46 positions shown; findings below may reference images not displayed]

FINDINGS: Lower chest: Lung bases are clear.

Hepatobiliary: No focal hepatic lesion. No biliary duct dilatation.
Gallbladder is normal. Common bile duct is normal.

Pancreas: Pancreas is normal. No ductal dilatation. No pancreatic
inflammation.

Spleen: Normal spleen

Adrenals/urinary tract: Adrenal glands normal.

No nephrolithiasis identified. No enhancing renal cortical lesion.
Mild hydroureter on the LEFT. There is a partially obstructing
calculus in the distal LEFT ureter at the LEFT vesicoureteral
junction. This small calculus measures 1 mm on image 80/2.

Stomach/Bowel: Stomach, small bowel, appendix, and cecum are normal.
The colon and rectosigmoid colon are normal.

Vascular/Lymphatic: Abdominal aorta is normal caliber. No periportal
or retroperitoneal adenopathy. No pelvic adenopathy.

Reproductive: Uterus and ovaries normal.

Other: No free fluid.

Musculoskeletal: No aggressive osseous lesion.
IMPRESSION: Small partially obstructing calculus in the distal LEFT ureter at
the vesicoureteral junction.

## 2021-05-16 ENCOUNTER — Other Ambulatory Visit: Payer: Self-pay | Admitting: Nephrology

## 2021-05-16 DIAGNOSIS — N1831 Chronic kidney disease, stage 3a: Secondary | ICD-10-CM

## 2021-05-21 ENCOUNTER — Ambulatory Visit
Admission: RE | Admit: 2021-05-21 | Discharge: 2021-05-21 | Disposition: A | Discharge status: Home / Self Care | Payer: Medicaid Other | Source: Ambulatory Visit | Attending: Nephrology | Admitting: Nephrology

## 2021-05-21 DIAGNOSIS — N1831 Chronic kidney disease, stage 3a: Secondary | ICD-10-CM

## 2022-01-21 ENCOUNTER — Other Ambulatory Visit: Payer: Self-pay | Admitting: Nurse Practitioner

## 2022-01-21 DIAGNOSIS — N6310 Unspecified lump in the right breast, unspecified quadrant: Secondary | ICD-10-CM

## 2022-01-29 ENCOUNTER — Ambulatory Visit
Admission: RE | Admit: 2022-01-29 | Discharge: 2022-01-29 | Disposition: A | Discharge status: Home / Self Care | Payer: Medicaid Other | Source: Ambulatory Visit | Attending: Nurse Practitioner | Admitting: Nurse Practitioner

## 2022-01-29 DIAGNOSIS — N6310 Unspecified lump in the right breast, unspecified quadrant: Secondary | ICD-10-CM

## 2022-02-07 ENCOUNTER — Emergency Department (HOSPITAL_COMMUNITY)
Admission: EM | Admit: 2022-02-07 | Discharge: 2022-02-07 | Disposition: A | Discharge status: Home / Self Care | Payer: Medicaid Other | Attending: Emergency Medicine | Admitting: Emergency Medicine

## 2022-02-07 ENCOUNTER — Emergency Department (HOSPITAL_COMMUNITY): Payer: Medicaid Other

## 2022-02-07 ENCOUNTER — Other Ambulatory Visit: Payer: Self-pay

## 2022-02-07 ENCOUNTER — Encounter (HOSPITAL_COMMUNITY): Payer: Self-pay

## 2022-02-07 DIAGNOSIS — R1031 Right lower quadrant pain: Secondary | ICD-10-CM | POA: Insufficient documentation

## 2022-02-07 DIAGNOSIS — Z7982 Long term (current) use of aspirin: Secondary | ICD-10-CM | POA: Diagnosis not present

## 2022-02-07 DIAGNOSIS — R197 Diarrhea, unspecified: Secondary | ICD-10-CM | POA: Diagnosis present

## 2022-02-07 LAB — CBC WITH DIFFERENTIAL/PLATELET
Abs Immature Granulocytes: 0.06 10*3/uL (ref 0.00–0.07)
Basophils Absolute: 0 10*3/uL (ref 0.0–0.1)
Basophils Relative: 0 %
Eosinophils Absolute: 0 10*3/uL (ref 0.0–0.5)
Eosinophils Relative: 0 %
HCT: 43.5 % (ref 36.0–46.0)
Hemoglobin: 14.8 g/dL (ref 12.0–15.0)
Immature Granulocytes: 0 %
Lymphocytes Relative: 2 %
Lymphs Abs: 0.4 10*3/uL — ABNORMAL LOW (ref 0.7–4.0)
MCH: 30.5 pg (ref 26.0–34.0)
MCHC: 34 g/dL (ref 30.0–36.0)
MCV: 89.7 fL (ref 80.0–100.0)
Monocytes Absolute: 1 10*3/uL (ref 0.1–1.0)
Monocytes Relative: 6 %
Neutro Abs: 15.2 10*3/uL — ABNORMAL HIGH (ref 1.7–7.7)
Neutrophils Relative %: 92 %
Platelets: 219 10*3/uL (ref 150–400)
RBC: 4.85 MIL/uL (ref 3.87–5.11)
RDW: 12 % (ref 11.5–15.5)
WBC: 16.6 10*3/uL — ABNORMAL HIGH (ref 4.0–10.5)
nRBC: 0 % (ref 0.0–0.2)

## 2022-02-07 LAB — URINALYSIS, ROUTINE W REFLEX MICROSCOPIC
Bilirubin Urine: NEGATIVE
Glucose, UA: NEGATIVE mg/dL
Hgb urine dipstick: NEGATIVE
Ketones, ur: NEGATIVE mg/dL
Leukocytes,Ua: NEGATIVE
Nitrite: NEGATIVE
Protein, ur: NEGATIVE mg/dL
Specific Gravity, Urine: 1.015 (ref 1.005–1.030)
pH: 7 (ref 5.0–8.0)

## 2022-02-07 LAB — COMPREHENSIVE METABOLIC PANEL
ALT: 18 U/L (ref 0–44)
AST: 28 U/L (ref 15–41)
Albumin: 4.5 g/dL (ref 3.5–5.0)
Alkaline Phosphatase: 49 U/L (ref 38–126)
Anion gap: 9 (ref 5–15)
BUN: 17 mg/dL (ref 6–20)
CO2: 24 mmol/L (ref 22–32)
Calcium: 9.6 mg/dL (ref 8.9–10.3)
Chloride: 106 mmol/L (ref 98–111)
Creatinine, Ser: 1.18 mg/dL — ABNORMAL HIGH (ref 0.44–1.00)
GFR, Estimated: 57 mL/min — ABNORMAL LOW (ref >60)
Glucose, Bld: 114 mg/dL — ABNORMAL HIGH (ref 70–99)
Potassium: 3.5 mmol/L (ref 3.5–5.1)
Sodium: 139 mmol/L (ref 135–145)
Total Bilirubin: 0.6 mg/dL (ref 0.3–1.2)
Total Protein: 8 g/dL (ref 6.5–8.1)

## 2022-02-07 LAB — LIPASE, BLOOD: Lipase: 36 U/L (ref 11–51)

## 2022-02-07 LAB — LACTIC ACID, PLASMA: Lactic Acid, Venous: 1.5 mmol/L (ref 0.5–1.9)

## 2022-02-07 MED ORDER — LACTATED RINGERS IV BOLUS
1000.0000 mL | Freq: Once | INTRAVENOUS | Status: AC
  Administered 2022-02-07: 1000 mL via INTRAVENOUS

## 2022-02-07 MED ORDER — HYDROMORPHONE HCL 1 MG/ML IJ SOLN
1.0000 mg | Freq: Once | INTRAMUSCULAR | Status: AC
  Administered 2022-02-07: 1 mg via INTRAVENOUS
  Filled 2022-02-07: qty 1

## 2022-02-07 MED ORDER — ONDANSETRON 4 MG PO TBDP
4.0000 mg | ORAL_TABLET | Freq: Once | ORAL | Status: AC
  Administered 2022-02-07: 4 mg via ORAL
  Filled 2022-02-07: qty 1

## 2022-02-07 MED ORDER — IOHEXOL 350 MG/ML SOLN
75.0000 mL | Freq: Once | INTRAVENOUS | Status: AC | PRN
  Administered 2022-02-07: 75 mL via INTRAVENOUS

## 2022-02-07 NOTE — ED Triage Notes (Signed)
Pt reports abd pain with diarrhea with rectal bleeding as bright red blood. Pain worse with movement.

## 2022-02-07 NOTE — ED Provider Triage Note (Signed)
Emergency Medicine Provider Triage Evaluation Note  Chelsea Cherry , a 51 y.o. female  was evaluated in triage.  Pt complains of mid abdominal pain (pressure sensation) onset last night. Also has associated diarrhea (one episode with bright red blood), nausea, vomiting.  No meds tried prior to arrival.  Denies history of hemorrhoids.  Denies chest pain, shortness of breath.  Denies sick contacts.  Review of Systems  Positive:  Negative:   Physical Exam  BP (!) 111/59   Pulse 95   Temp 98.2 F (36.8 C)   Resp 20   SpO2 99%  Gen:   Awake, no distress   Resp:  Normal effort  MSK:   Moves extremities without difficulty  Other:  Diffuse abdominal tenderness to palpation  Medical Decision Making  Medically screening exam initiated at 4:00 PM.  Appropriate orders placed.  Kella Splinter was informed that the remainder of the evaluation will be completed by another provider, this initial triage assessment does not replace that evaluation, and the importance of remaining in the ED until their evaluation is complete.  Work-up initiated   Haddon Fyfe A, PA-C 02/07/22 1600

## 2022-02-07 NOTE — Discharge Instructions (Addendum)
If you develop worsening, continued, or recurrent abdominal pain, uncontrolled vomiting, fever, chest or back pain, or any other new/concerning symptoms then return to the ER for evaluation.  

## 2022-02-07 NOTE — ED Notes (Signed)
Pt needs labs and 22g or greater IV for CT

## 2022-02-07 NOTE — ED Provider Notes (Signed)
[facility] DEPT Provider Note   CSN: [id number] Arrival date & time: 02/07/22  1544     History  Chief Complaint  Patient presents with   Abdominal Pain   Rectal Bleeding    Chelsea Cherry is a 51 y.o. female.  HPI 51 year old female presents with abdominal pain, diarrhea and rectal bleeding.  Started feeling bad last night before bed.  Noticed some upper abdominal pain.  She then had diarrhea and vomiting all night.  Has not had any vomiting or nausea since about 9 AM but has not been eating since yesterday.  She is feeling weaker and weaker.  Today she then went to the bathroom but could not make it in time and had diarrhea on her leg.  She then noticed that it was actually just blood and when she went to the bathroom had a bloody bowel movement.  She thinks she has had 3 total bloody bowel movements.  Now she just has a little bit of blood when she wipes.  The abdominal pain is there all the time but mild.  Significantly worsens with cramping just before having a bowel movement.  She denies any fevers but is having cramping in her extremities as well.  She is not on blood thinners.  No recent travel or antibiotic use.  Home Medications Prior to Admission medications   Medication Sig Start Date End Date Taking? Authorizing Provider  aspirin EC 325 MG tablet Take 325 mg by mouth daily. 05/07/20   [provider]  busPIRone (BUSPAR) 10 MG tablet Take 10 mg by mouth 3 (three) times daily. 04/26/19   [provider]  cyclobenzaprine (FLEXERIL) 5 MG tablet Take 5-10 mg by mouth See admin instructions. Take 10 mg in the morning, 5 mg at afternoon, and 10 mg at bedtime 03/07/20   [provider]  dexlansoprazole (DEXILANT) 60 MG capsule Take 60 mg by mouth daily.    [provider]  Docusate Sodium (COLACE PO) Take 250 mg by mouth daily.    [provider]  DULoxetine (CYMBALTA) 60 MG capsule Take 2 capsules (120 mg  total) by mouth daily. 09/14/17   Norman Clay, MD  ergocalciferol (VITAMIN D2) 1.25 MG (50000 UT) capsule Take 50,000 Units by mouth every Sunday.    [provider]  fenofibrate 54 MG tablet Take 54 mg by mouth at bedtime. 02/23/20   [provider]  FEROSUL 325 (65 Fe) MG tablet Take 325 mg by mouth 2 (two) times a week.  03/17/19   [provider]  fluticasone (FLONASE) 50 MCG/ACT nasal spray Place 1 spray into both nostrils daily.    [provider]  gabapentin (NEURONTIN) 300 MG capsule Take 300-600 mg by mouth 2 (two) times daily. 300 mg in the morning, 300 mg at afternoon, and Take 600 mg by mouth at bedtime    [provider]  GLUCOSAMINE-CHONDROITIN PO Take 1 tablet by mouth 2 (two) times daily. 1500 mg /1103 mg    [provider]  HYDROcodone-acetaminophen (NORCO) 7.5-325 MG tablet Take 1-2 tablets by mouth See admin instructions. Take 2 tabs in AM, 6 hours later take 2 tablets and 1 tablet at bedtime    [provider]  icosapent Ethyl (VASCEPA) 1 g capsule Take 2 g by mouth 2 (two) times daily. 04/19/20   [provider]  levothyroxine (SYNTHROID, LEVOTHROID) 50 MCG tablet Take 50 mcg by mouth daily before breakfast.    [provider]  Rolan Lipa  72 MCG capsule Take 72-144 mcg by mouth daily. 05/25/20   [provider]  loratadine (CLARITIN) 10 MG tablet Take 10 mg by mouth daily.    [provider]  metFORMIN (GLUCOPHAGE) 500 MG tablet Take 1,000 mg by mouth at bedtime.    [provider]  metoprolol succinate (TOPROL-XL) 50 MG 24 hr tablet Take 1 tablet (50 mg total) by mouth daily. Take with or immediately following a meal. 06/04/20   Allred, Jeneen Rinks, MD  mirabegron ER (MYRBETRIQ) 50 MG TB24 tablet Take 50 mg by mouth daily.    [provider]  Multiple Vitamins-Minerals (MULTIVITAMIN WITH MINERALS) tablet Take 1 tablet by mouth daily.    [provider]  NARCAN 4  MG/0.1ML LIQD nasal spray kit Place 1 spray into the nose once.  07/09/18   [provider]  pravastatin (PRAVACHOL) 80 MG tablet Take 80 mg by mouth at bedtime. 01/24/19   [provider]  SUMAtriptan (IMITREX) 50 MG tablet Take 50 mg by mouth every 2 (two) hours as needed for headache or migraine. 05/09/19   [provider]  topiramate (TOPAMAX) 100 MG tablet Take 100 mg by mouth at bedtime. 05/07/20   [provider]  traZODone (DESYREL) 100 MG tablet Take 100 mg by mouth at bedtime. 04/13/19   [provider]      Allergies    Patient has no known allergies.    Review of Systems   Review of Systems  Constitutional:  Negative for fever.  Gastrointestinal:  Positive for abdominal pain, blood in stool, diarrhea, nausea and vomiting. Negative for rectal pain.  Neurological:  Positive for light-headedness. Negative for syncope.    Physical Exam Updated Vital Signs BP 99/67   Pulse 82   Temp 99.4 F (37.4 C)   Resp 17   SpO2 97%  Physical Exam Vitals and nursing note reviewed.  Constitutional:      General: She is not in acute distress.    Appearance: She is well-developed. She is not ill-appearing or diaphoretic.  HENT:     Head: Normocephalic and atraumatic.  Cardiovascular:     Rate and Rhythm: Normal rate and regular rhythm.     Heart sounds: Normal heart sounds.  Pulmonary:     Effort: Pulmonary effort is normal.     Breath sounds: Normal breath sounds.  Abdominal:     Palpations: Abdomen is soft.     Tenderness: There is abdominal tenderness in the right lower quadrant.  Skin:    General: Skin is warm and dry.  Neurological:     Mental Status: She is alert.     ED Results / Procedures / Treatments   Labs (all labs ordered are listed, but only abnormal results are displayed) Labs Reviewed  CBC WITH DIFFERENTIAL/PLATELET - Abnormal; Notable for the following components:      Result Value   WBC 16.6 (*)    Neutro Abs 15.2  (*)    Lymphs Abs 0.4 (*)    All other components within normal limits  COMPREHENSIVE METABOLIC PANEL - Abnormal; Notable for the following components:   Glucose, Bld 114 (*)    Creatinine, Ser 1.18 (*)    GFR, Estimated 57 (*)    All other components within normal limits  URINALYSIS, ROUTINE W REFLEX MICROSCOPIC - Abnormal; Notable for the following components:   Color, Urine AMBER (*)    APPearance CLOUDY (*)    All other components within normal limits  GASTROINTESTINAL PANEL  BY PCR, STOOL (REPLACES STOOL CULTURE)  C DIFFICILE QUICK SCREEN W PCR REFLEX    LIPASE, BLOOD  LACTIC ACID, PLASMA  POC OCCULT BLOOD, ED    EKG None  Radiology CT ANGIO GI BLEED  Result Date: 02/07/2022 CLINICAL DATA:  Abdominal pain, diarrhea, rectal bleeding EXAM: CTA ABDOMEN AND PELVIS WITHOUT AND WITH CONTRAST TECHNIQUE: Multidetector CT imaging of the abdomen and pelvis was performed using the standard protocol during bolus administration of intravenous contrast. Multiplanar reconstructed images and MIPs were obtained and reviewed to evaluate the vascular anatomy. RADIATION DOSE REDUCTION: This exam was performed according to the departmental dose-optimization program which includes automated exposure control, adjustment of the mA and/or kV according to patient size and/or use of iterative reconstruction technique. CONTRAST:  22m OMNIPAQUE IOHEXOL 350 MG/ML SOLN COMPARISON:  05/20/2019 FINDINGS: VASCULAR Aorta: There is no dissection or focal aneurysmal dilation. Small calcifications and atherosclerotic plaques are seen in infrarenal aorta. Celiac: Appears normal SMA: Appears normal Renals: Appear normal IMA: Appears normal Inflow: Unremarkable. Proximal Outflow: Unremarkable. Veins: Unremarkable. Review of the MIP images confirms the above findings. NON-VASCULAR Lower chest: Unremarkable. Hepatobiliary: No focal abnormalities are seen in liver. There is no dilation of bile ducts. Gallbladder is distended,  possibly due to fasting state. There is no wall thickening. Pancreas: No focal abnormalities are seen. Spleen: Unremarkable. Adrenals/Urinary Tract: Adrenals are unremarkable. There is no hydronephrosis. In the noncontrast images, there is subtle ill-defined increased density in minor calices and renal pelvis on both sides, possibly renal stones or excretion of contrast due to earlier contrast administration. Urinary bladder is not distended. Stomach/Bowel: Stomach is unremarkable. Small bowel loops are not dilated. Appendix is not dilated. There is high density in the lumen of cecum and right colon which does not change significantly between the noncontrast and contrast enhanced images. There is no significant wall thickening in colon. There is no definite demonstrable active extravasation of contrast in the bowel lumen. No focal enhancing lesions are seen in the bowel wall. Lymphatic: Unremarkable. Reproductive: Unremarkable. Other: There is no ascites or pneumoperitoneum. Umbilical hernia containing fat is seen. Musculoskeletal: There is minimal anterolisthesis at L4-L5 level. No acute findings are seen in the bony structures. IMPRESSION: VASCULAR Abdominal aorta and its major branches are patent. There is no demonstrable active extravasation of contrast. There is high density in the lumen of cecum and right colon in the noncontrast images which does not change significantly in the postcontrast images. This finding may be due to recent ingestion of oral medication. NON-VASCULAR There is no evidence of intestinal obstruction or pneumoperitoneum. No focal enhancing lesions are seen in the bowel wall. There is no hydronephrosis. In the noncontrast images, small amount of high density is seen in the collecting systems including the renal pelvis in both kidneys suggesting possible recent small amount of contrast administration prior to the CT arteriogram. Possibility of small renal stones could not be evaluated.  Electronically Signed   By: PElmer PickerM.D.   On: 02/07/2022 19:55    Procedures Procedures    Medications Ordered in ED Medications  ondansetron (ZOFRAN-ODT) disintegrating tablet 4 mg (4 mg Oral Given 02/07/22 1803)  lactated ringers bolus 1,000 mL (0 mLs Intravenous Stopped 02/07/22 2010)  HYDROmorphone (DILAUDID) injection 1 mg (1 mg Intravenous Given 02/07/22 1803)  lactated ringers bolus 1,000 mL (0 mLs Intravenous Stopped 02/07/22 2010)  iohexol (OMNIPAQUE) 350 MG/ML injection 75 mL (75 mLs Intravenous Contrast Given 02/07/22 1915)    ED Course/ Medical Decision Making/  A&P                           Medical Decision Making Amount and/or Complexity of Data Reviewed External Data Reviewed: notes. Labs: ordered.    Details: Leukocytosis is probably reactive to vomiting and acute illness no significant electrolyte disturbance.  Mild bump in creatinine but no AKI.  Normal lactate. Radiology: ordered and independent interpretation performed.    Details: No evidence of obvious colitis or arterial obstruction  Risk Prescription drug management.   Patient appears to have infectious diarrhea.  Of note, her blood pressure tends to run low according to her, in the 90s.  She is having a lot of cramping and feels a lot better with some pain medicine, fluids.  CT is overall unremarkable.  Could have been some mild ischemic colitis with the blood versus bleeding due to the amount of diarrhea she has been having.  Either way, hemoglobin is normal, vital signs are stable for her, and she is feeling a lot better.  I think I would hold off on antibiotics for now.  I have ordered GI pathogen panel and C. difficile testing but despite being here 6 hours she has not had a bowel movement.  Perhaps she is improving and at this point I think she is stable for discharge.  She was able to ambulate and feels well.  She ate.  Will discharge home with return precautions and general supportive  care.        Final Clinical Impression(s) / ED Diagnoses Final diagnoses:  Diarrhea of presumed infectious origin    Rx / DC Orders ED Discharge Orders     None         Sherwood Gambler, MD 02/07/22 2216

## 2022-02-07 NOTE — ED Notes (Signed)
Pt ambulated to restroom with one assist, she states she feels much better and her legs aren't shaky any longer.

## 2022-04-30 ENCOUNTER — Other Ambulatory Visit: Payer: Self-pay

## 2022-04-30 ENCOUNTER — Ambulatory Visit: Payer: Medicaid Other | Attending: Nurse Practitioner

## 2022-04-30 DIAGNOSIS — R2689 Other abnormalities of gait and mobility: Secondary | ICD-10-CM | POA: Diagnosis present

## 2022-04-30 DIAGNOSIS — M5459 Other low back pain: Secondary | ICD-10-CM | POA: Diagnosis present

## 2022-04-30 DIAGNOSIS — M6281 Muscle weakness (generalized): Secondary | ICD-10-CM | POA: Diagnosis present

## 2022-04-30 NOTE — Therapy (Signed)
OUTPATIENT PHYSICAL THERAPY THORACOLUMBAR EVALUATION   Patient Name: Chelsea Cherry MRN: [id number] DOB:08/25/1970, 52 y.o., female Today's Date: 04/30/2022  END OF SESSION:  PT End of Session - 04/30/22 1715     Visit Number 1    Number of Visits 17    Date for PT Re-Evaluation 06/25/22    Authorization Type [employer]    PT Start Time 1355    PT Stop Time 1440    PT Time Calculation (min) 45 min    Activity Tolerance Patient tolerated treatment well    Behavior During Therapy WFL for tasks assessed/performed             Past Medical History:  Diagnosis Date   Anxiety    Chronic headaches    Colon polyps    Depression    Fibroid    Fibromyalgia    Hyperlipidemia    IBS (irritable bowel syndrome)    Mitral valve prolapse    SVT (supraventricular tachycardia)    UTI (urinary tract infection)    Vaginal Pap smear, abnormal    Vitamin D deficiency    Past Surgical History:  Procedure Laterality Date   abdominal laproscopic surgery     BREAST BIOPSY Right    BREAST EXCISIONAL BIOPSY Right    x3   BREAST EXCISIONAL BIOPSY Left    CERVICAL BIOPSY  W/ LOOP ELECTRODE EXCISION     FOOT SURGERY     SVT ABLATION N/A 05/01/2020   Procedure: SVT ABLATION;  Surgeon: Thompson Grayer, MD;  Location: [facility];  Service: Cardiovascular;  Laterality: N/A;   thumb surgery     TUBAL LIGATION     WISDOM TOOTH EXTRACTION     Patient Active Problem List   Diagnosis Date Noted   SVT (supraventricular tachycardia) 02/27/2020   Vaginitis, atrophic 02/27/2020   Dyspareunia in female 02/27/2020   Chronic pelvic pain in female 02/27/2020   Well woman exam with routine gynecological exam 02/27/2020   Fibromyalgia 02/27/2020   Bipolar II disorder (HCC)    MDD (major depressive disorder), recurrent severe, without psychosis (Chelsea Cherry) 04/04/2019   MDD (major depressive disorder), recurrent episode, moderate ([place]) 04/17/2017    PCP: Simona Huh, NP   REFERRING  PROVIDER: Simona Huh, NP   REFERRING DIAG: M54.50 (ICD-10-CM) - Low back pain, unspecified   Rationale for Evaluation and Treatment: Rehabilitation  THERAPY DIAG:  Other low back pain  Muscle weakness (generalized)  Other abnormalities of gait and mobility  ONSET DATE: Chronic  SUBJECTIVE:  SUBJECTIVE STATEMENT: Pt presents to PT with reports of chronic LBP and discomfort since injury from fall approximately 15 years ago. She promotes pain and N/T in both LE, L>R. Denies bowe/bladder changes or saddle anesthesia. Has not had any relief of symptoms with medication lately, is frustrated by inability to find positions of comfort. Has not had any SVT since ablation 2 years ago. 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   PERTINENT HISTORY:  SVT, Depression, Fibromyalgia   PAIN:  Are you having pain?  Yes: NPRS scale: 9/10 Worst: 10/10 Pain location: lower back, LE (L>R),  Pain description: sharp, N/T Aggravating factors: prolonged standing, prolonged sitting, walking Relieving factors: heat, medication   PRECAUTIONS: None  WEIGHT BEARING RESTRICTIONS: No  FALLS:  Has patient fallen in last 6 months? No  LIVING ENVIRONMENT: Lives with: lives with their family Lives in: House/apartment  OCCUPATION: Technical brewer for [employer]   PLOF: Independent  PATIENT GOALS: decrease pain in order to improve comfort home and work activities   OBJECTIVE:   DIAGNOSTIC FINDINGS:  See imaging   PATIENT SURVEYS:  ODI: 35/50 - 70% disability   COGNITION: Overall cognitive status: Within functional limits for tasks assessed     SENSATION: Light touch: Impaired - decreased light touch L LE  POSTURE: rounded shoulders, forward head, and increased lumbar  lordosis  MUSCLE LENGTH:  Thomas Test: (+)   PALPATION: TTP to bilateral lumbar paraspinals, L proximal hip   LUMBAR ROM:   AROM eval  Flexion Reduced with pain  Extension Reduced with pain  Right lateral flexion   Left lateral flexion   Right rotation Reduced with pain  Left rotation Reduced with pain   (Blank rows = not tested)  LOWER EXTREMITY MMT:    MMT Right eval Left eval  Hip flexion 4/5 3+/5  Hip extension    Hip abduction 3+/5 3+/5  Hip adduction    Hip internal rotation    Hip external rotation    Knee flexion    Knee extension     (Blank rows = not tested)  LOWER EXTREMITY MYOTOMES:  Myotome Right Left  L2 4/5 3/5  L3 4/5 3/5  L4 4/5 3/5  L5 4/5 4/5  S1 4/5 4/5    LUMBAR SPECIAL TESTS:  Straight leg raise test: Positive, Slump test: Positive, Stork standing: Positive, SI Compression/distraction test: Positive, and Long sit test: Positive Left  FUNCTIONAL TESTS:  30 Second STS: 5 reps  GAIT: Distance walked: 46f Assistive device utilized: None Level of assistance: Complete Independence Comments: antalgic gait L LE, decreased gait speed  TREATMENT: [facility] Adult PT Treatment:                                                DATE: 04/30/2022 Therapeutic Exercise: Seated sciatic nerve glide x 5 L Supine PPT x 10 - 5" hold Modified thomas stretch x 60" each  PATIENT EDUCATION:  Education details: eval findings, ODI, HEP, POC Person educated: Patient Education method: Explanation, Demonstration, and Handouts Education comprehension: verbalized understanding and returned demonstration  HOME EXERCISE PROGRAM: Access Code: [email or web address]: [email or web address] Date: 04/30/2022 Prepared by: DOctavio Manns Exercises - Seated Sciatic Tensioner  - 1 x daily - 7 x weekly - 2 sets - 10 reps - Supine Posterior Pelvic Tilt  - 1 x daily - 7  x weekly - 2 sets - 10 reps - 3-5 sec hold - Modified Thomas Stretch  - 1 x daily - 7 x weekly - 2-3  sets - 60 sec hold  ASSESSMENT:  CLINICAL IMPRESSION: Patient is a 52 y.o. F who was seen today for physical therapy evaluation and treatment for chronic LBP and discomfort. Physical findings are consistent with NP impression as pt demonstrates siginicaitn decrease in lumbar ROM, strength, and functional mobility below PLOF. Her ODI demonstrates severe disability in performance of home ADLs and community activities. Pt would benefit from skilled PT services, will work on improving core and proximal hip strength in gym and aquatic sessions to decrease pain and improve function.    OBJECTIVE IMPAIRMENTS: decreased activity tolerance, decreased balance, decreased mobility, difficulty walking, decreased ROM, decreased strength, postural dysfunction, and pain.   ACTIVITY LIMITATIONS: carrying, lifting, bending, standing, squatting, stairs, and transfers  PARTICIPATION LIMITATIONS: driving, shopping, community activity, occupation, and yard work  PERSONAL FACTORS: Time since onset of injury/illness/exacerbation and 1-2 comorbidities: SVT, Depression, Fibromyalgia   are also affecting patient's functional outcome.   REHAB POTENTIAL: Good  CLINICAL DECISION MAKING: Evolving/moderate complexity  EVALUATION COMPLEXITY: Moderate   GOALS: Goals reviewed with patient? No  SHORT TERM GOALS: Target date: 05/21/2022   Pt will be compliant and knowledgeable with initial HEP for improved comfort and carryover Baseline: initial HEP given  Goal status: INITIAL  2.  Pt will self report back pain no greater than 7-8/10 for improved comfort and functional ability Baseline: 10/10 at worst Goal status: INITIAL   LONG TERM GOALS: Target date: 06/25/2022    Pt will be decrease ODI disability score to no greater than 55% as proxy for functional improvement Baseline:  35/50 - 70% disability Goal status: INITIAL  2.  Pt will self report back pain no greater than 2-3/10 for improved comfort and functional  ability Baseline: 10/10 at worst Goal status: INITIAL   3.  Pt will increase 30 Second Sit to Stand rep count to no less than 7 reps (MCID 2)for improved balance, strength, and functional mobility Baseline: 5 reps  Goal status: INITIAL   4.  Pt will improve bilateral LE MMT to no less than 4/5 for all tested motions for improved comfort and functional mobility with home ADLs and transfers Baseline: see chart Goal status: INITIAL  PLAN:  PT FREQUENCY: 2x/week  PT DURATION: 8 weeks  PLANNED INTERVENTIONS: Therapeutic exercises, Therapeutic activity, Neuromuscular re-education, Balance training, Gait training, Patient/Family education, Self Care, Joint mobilization, Aquatic Therapy, Dry Needling, Electrical stimulation, Cryotherapy, Moist heat, Manual therapy, and Re-evaluation.  PLAN FOR NEXT SESSION: assess HEP response, core and hip strengthening  Check all possible CPT codes: 97164 - PT Re-evaluation, 97110- Therapeutic Exercise, [id number]- Neuro Re-education, [phone] - Gait Training, [phone] - Manual Therapy, 97530 - Therapeutic Activities, [phone] - Self Care, [phone] - Electrical stimulation (unattended), [phone] - Electrical stimulation (Manual), and [id number] - Aquatic therapy    Check all conditions that are expected to impact treatment: None of these apply   If treatment provided at initial evaluation, no treatment charged due to lack of authorization.       Ward Chatters, PT 04/30/2022, 5:16 PM

## 2022-05-06 ENCOUNTER — Ambulatory Visit: Payer: Medicaid Other

## 2022-05-06 DIAGNOSIS — M5459 Other low back pain: Secondary | ICD-10-CM

## 2022-05-06 DIAGNOSIS — M6281 Muscle weakness (generalized): Secondary | ICD-10-CM

## 2022-05-06 DIAGNOSIS — R2689 Other abnormalities of gait and mobility: Secondary | ICD-10-CM
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
## 2022-05-06 NOTE — Patient Instructions (Signed)
Aquatic Therapy at [facility]-  What to Expect! ? ?Where:  ? ?[place] [facility] ?[address] ?[place],  [address] ?Rehab phone [phone] ? ?NOTE:  You will receive an automated phone message reminding you of your appt and it will say the appointment is at the [address] [facility] [address] [facility] clinic. ? ?        ?How to Prepare: ?Please make sure you drink 8 ounces of water about one hour prior to your pool session ?A caregiver may attend if needed with the patient to help assist as needed. A caregiver can sit in the pool room on chair. ?Please arrive IN YOUR SUIT and 15 minutes prior to your appointment - this helps to avoid delays in starting your session. ?Please make sure to attend to any toileting needs prior to entering the pool ?Locker rooms for changing are provided.   There is direct access to the pool deck form the locker room.  You can lock your belongings in a locker with lock provided. ?Once on the pool deck your therapist will ask if you have signed the Patient  Consent and Assignment of Benefits form before beginning treatment ?Your therapist may take your blood pressure prior to, during and after your session if indicated ?We usually try and create a home exercise program based on activities we do in the pool.  Please be thinking about who might be able to assist you in the pool should you need to participate in an aquatic home exercise program at the time of discharge if you need assistance.  Some patients do not want to or do not have the ability to participate in an aquatic home program - this is not a barrier in any way to you participating in aquatic therapy as part of your current therapy plan! ?After Discharge from PT, you can continue using home program at  the [facility]/, there is a drop-in fee for $5 ($45 a month)or for 60 years  or older $4.00 ($40 a month for seniors ) or any local [facility] pool.  Memberships for purchase are  available for gym/pool at [facility] ? ?IT IS VERY IMPORTANT THAT YOUR LAST VISIT BE IN THE CLINIC AT [address] AFTER YOUR LAST AQUATIC VISIT.  PLEASE MAKE SURE THAT YOU HAVE A [address]  APPOINTMENT SCHEDULED. ? ? ?About the pool: ?Pool is located approximately 500 FT from the entrance of the building.  ?Please bring a support person if you need assistance traveling this      distance.  ? ?Your therapist will assist you in entering the water; there are two ways to     ?      enter: stairs with railings, and a mechanical lift. Your therapist will determine the most appropriate way for you. ? ?Water temperature is usually between 88-90 degrees ? ?There may be up to 2 other swimmers in the pool at the same time ? ?The pool deck is tile, please wear shoes with good traction if you prefer not to be barefoot.  ? ? ?Contact Info:  ?For appointment scheduling and cancellations:         Please call the [facility]  PH:[phone]              Aquatic Therapy  ?[facility]       All sessions are 45 minutes ? ? ? ? ?        ? ?                ?                      ?

## 2022-05-08 ENCOUNTER — Ambulatory Visit: Payer: Medicaid Other | Attending: Nurse Practitioner

## 2022-05-08 DIAGNOSIS — R2689 Other abnormalities of gait and mobility: Secondary | ICD-10-CM | POA: Diagnosis present

## 2022-05-08 DIAGNOSIS — M5459 Other low back pain: Secondary | ICD-10-CM | POA: Insufficient documentation

## 2022-05-08 DIAGNOSIS — M6281 Muscle weakness (generalized): Secondary | ICD-10-CM | POA: Insufficient documentation

## 2022-05-08 NOTE — Therapy (Signed)
OUTPATIENT PHYSICAL THERAPY TREATMENT NOTE   Patient Name: Chelsea Cherry MRN: [id number] DOB:1971/02/22, 52 y.o., female Today's Date: 05/08/2022  PCP: Simona Huh, NP  REFERRING PROVIDER: Simona Huh, NP   END OF SESSION:   PT End of Session - 05/08/22 1533     Visit Number 3    Number of Visits 17    Date for PT Re-Evaluation 06/25/22    Authorization Type [employer]    PT Start Time 1533    PT Stop Time 1612    PT Time Calculation (min) 39 min    Activity Tolerance Patient tolerated treatment well    Behavior During Therapy WFL for tasks assessed/performed              Past Medical History:  Diagnosis Date   Anxiety    Chronic headaches    Colon polyps    Depression    Fibroid    Fibromyalgia    Hyperlipidemia    IBS (irritable bowel syndrome)    Mitral valve prolapse    SVT (supraventricular tachycardia)    UTI (urinary tract infection)    Vaginal Pap smear, abnormal    Vitamin D deficiency    Past Surgical History:  Procedure Laterality Date   abdominal laproscopic surgery     BREAST BIOPSY Right    BREAST EXCISIONAL BIOPSY Right    x3   BREAST EXCISIONAL BIOPSY Left    CERVICAL BIOPSY  W/ LOOP ELECTRODE EXCISION     FOOT SURGERY     SVT ABLATION N/A 05/01/2020   Procedure: SVT ABLATION;  Surgeon: Thompson Grayer, MD;  Location: [facility];  Service: Cardiovascular;  Laterality: N/A;   thumb surgery     TUBAL LIGATION     WISDOM TOOTH EXTRACTION     Patient Active Problem List   Diagnosis Date Noted   SVT (supraventricular tachycardia) 02/27/2020   Vaginitis, atrophic 02/27/2020   Dyspareunia in female 02/27/2020   Chronic pelvic pain in female 02/27/2020   Well woman exam with routine gynecological exam 02/27/2020   Fibromyalgia 02/27/2020   Bipolar II disorder (HCC)    MDD (major depressive disorder), recurrent severe, without psychosis ([place]) 04/04/2019   MDD (major depressive disorder), recurrent episode, moderate ([place])  04/17/2017    REFERRING DIAG: M54.50 (ICD-10-CM) - Low back pain, unspecified    THERAPY DIAG:  Other low back pain  Muscle weakness (generalized)  Other abnormalities of gait and mobility  Rationale for Evaluation and Treatment Rehabilitation  PERTINENT HISTORY: SVT, Depression, Fibromyalgia    PRECAUTIONS: None  SUBJECTIVE:  SUBJECTIVE STATEMENT:  Pt presents to PT with reports of continued pain. Has been compliant with HEP.   PAIN:  Are you having pain?  Yes: NPRS scale: 6/10 Worst: 10/10 Pain location: lower back, LE (L>R),  Pain description: sharp, N/T Aggravating factors: prolonged standing, prolonged sitting, walking Relieving factors: heat, medication    OBJECTIVE: (objective measures completed at initial evaluation unless otherwise dated)   DIAGNOSTIC FINDINGS:  See imaging    PATIENT SURVEYS:  ODI: 35/50 - 70% disability    COGNITION: Overall cognitive status: Within functional limits for tasks assessed                          SENSATION: Light touch: Impaired - decreased light touch L LE   POSTURE: rounded shoulders, forward head, and increased lumbar lordosis   MUSCLE LENGTH:            Thomas Test: (+)    PALPATION: TTP to bilateral lumbar paraspinals, L proximal hip    LUMBAR ROM:    AROM eval  Flexion Reduced with pain  Extension Reduced with pain  Right lateral flexion    Left lateral flexion    Right rotation Reduced with pain  Left rotation Reduced with pain   (Blank rows = not tested)   LOWER EXTREMITY MMT:     MMT Right eval Left eval  Hip flexion 4/5 3+/5  Hip extension      Hip abduction 3+/5 3+/5  Hip adduction      Hip internal rotation      Hip external rotation      Knee flexion      Knee extension       (Blank rows = not tested)    LOWER EXTREMITY MYOTOMES:   Myotome Right Left  L2 4/5 3/5  L3 4/5 3/5  L4 4/5 3/5  L5 4/5 4/5  S1 4/5 4/5      LUMBAR SPECIAL TESTS:  Straight leg raise test: Positive, Slump test: Positive, Stork standing: Positive, SI Compression/distraction test: Positive, and Long sit test: Positive Left   FUNCTIONAL TESTS:  30 Second STS: 5 reps   GAIT: Distance walked: 26f Assistive device utilized: None Level of assistance: Complete Independence Comments: antalgic gait L LE, decreased gait speed   TREATMENT: [facility] Adult PT Treatment:                                                DATE: 05/08/22 Therapeutic Exercise: Nustep level 5 x 5 mins while gathering subjective and planning session with patient Supine PPT 2x10 5" hold Supine PT 2x10 - 5" hold Supine PPT with clam 2x10 BTB Bridge 2x10 STS x 10 - low table no UE  [facility] Adult PT Treatment:                                                DATE: 05/06/22 Therapeutic Exercise: Nustep level 5 x 5 mins while gathering subjective and planning session with patient Palloff press 3# x10 BIL Standing hip abduction/extension RTB at ankles 2x10 BIL PPT 5" hold 2x10 Bridges 2x10 LTR x10 BIL Modified thomas stretch EOM x1' BIL   [facility]  DATE: 04/30/2022 Therapeutic Exercise: Seated sciatic nerve glide x 5 L Supine PPT x 10 - 5" hold Modified thomas stretch x 60" each   PATIENT EDUCATION:  Education details: eval findings, ODI, HEP, POC Person educated: Patient Education method: Explanation, Demonstration, and Handouts Education comprehension: verbalized understanding and returned demonstration   HOME EXERCISE PROGRAM: Access Code: [email or web address] URL: [email or web address] Date: 04/30/2022 Prepared by: Octavio Manns   Exercises - Seated Sciatic Tensioner  - 1 x daily - 7 x weekly - 2 sets - 10 reps - Supine Posterior Pelvic Tilt  - 1 x daily - 7 x weekly - 2  sets - 10 reps - 3-5 sec hold - Modified Thomas Stretch  - 1 x daily - 7 x weekly - 2-3 sets - 60 sec hold   ASSESSMENT:   CLINICAL IMPRESSION: Pt was able to complete all prescribed exercises with no adverse effect or increase in pain. Therapy focused on improving core and proximal hip strength in order to decrease pain and improve comfort. She is progressing as tolerated, will continue per POC.    OBJECTIVE IMPAIRMENTS: decreased activity tolerance, decreased balance, decreased mobility, difficulty walking, decreased ROM, decreased strength, postural dysfunction, and pain.    ACTIVITY LIMITATIONS: carrying, lifting, bending, standing, squatting, stairs, and transfers   PARTICIPATION LIMITATIONS: driving, shopping, community activity, occupation, and yard work   PERSONAL FACTORS: Time since onset of injury/illness/exacerbation and 1-2 comorbidities: SVT, Depression, Fibromyalgia   are also affecting patient's functional outcome.     GOALS: Goals reviewed with patient? No   SHORT TERM GOALS: Target date: 05/21/2022   Pt will be compliant and knowledgeable with initial HEP for improved comfort and carryover Baseline: initial HEP given  Goal status: INITIAL   2.  Pt will self report back pain no greater than 7-8/10 for improved comfort and functional ability Baseline: 10/10 at worst Goal status: INITIAL    LONG TERM GOALS: Target date: 06/25/2022     Pt will be decrease ODI disability score to no greater than 55% as proxy for functional improvement Baseline:  35/50 - 70% disability Goal status: INITIAL   2.  Pt will self report back pain no greater than 2-3/10 for improved comfort and functional ability Baseline: 10/10 at worst Goal status: INITIAL    3.  Pt will increase 30 Second Sit to Stand rep count to no less than 7 reps (MCID 2)for improved balance, strength, and functional mobility Baseline: 5 reps  Goal status: INITIAL    4.  Pt will improve bilateral LE MMT to no  less than 4/5 for all tested motions for improved comfort and functional mobility with home ADLs and transfers Baseline: see chart Goal status: INITIAL   PLAN:   PT FREQUENCY: 2x/week   PT DURATION: 8 weeks   PLANNED INTERVENTIONS: Therapeutic exercises, Therapeutic activity, Neuromuscular re-education, Balance training, Gait training, Patient/Family education, Self Care, Joint mobilization, Aquatic Therapy, Dry Needling, Electrical stimulation, Cryotherapy, Moist heat, Manual therapy, and Re-evaluation.   PLAN FOR NEXT SESSION: assess HEP response, core and hip strengthening                 Ward Chatters, PT 05/08/2022, 4:14 PM

## 2022-05-13 ENCOUNTER — Ambulatory Visit: Payer: Medicaid Other

## 2022-05-13 DIAGNOSIS — M5459 Other low back pain: Secondary | ICD-10-CM

## 2022-05-13 DIAGNOSIS — M6281 Muscle weakness (generalized): Secondary | ICD-10-CM

## 2022-05-13 DIAGNOSIS — R2689 Other abnormalities of gait and mobility: Secondary | ICD-10-CM

## 2022-05-13 NOTE — Therapy (Signed)
OUTPATIENT PHYSICAL THERAPY TREATMENT NOTE   Patient Name: Chelsea Cherry MRN: [id number] DOB:12-31-1970, 52 y.o., female Today's Date: 05/13/2022  PCP: Simona Huh, NP  REFERRING PROVIDER: Simona Huh, NP   END OF SESSION:   PT End of Session - 05/13/22 1624     Visit Number 4    Number of Visits 17    Date for PT Re-Evaluation 06/25/22    Authorization Type [employer]    PT Start Time 1624   Pt arrived 9 mins late to appt   PT Stop Time 1658    PT Time Calculation (min) 34 min    Activity Tolerance Patient tolerated treatment well    Behavior During Therapy WFL for tasks assessed/performed              Past Medical History:  Diagnosis Date   Anxiety    Chronic headaches    Colon polyps    Depression    Fibroid    Fibromyalgia    Hyperlipidemia    IBS (irritable bowel syndrome)    Mitral valve prolapse    SVT (supraventricular tachycardia)    UTI (urinary tract infection)    Vaginal Pap smear, abnormal    Vitamin D deficiency    Past Surgical History:  Procedure Laterality Date   abdominal laproscopic surgery     BREAST BIOPSY Right    BREAST EXCISIONAL BIOPSY Right    x3   BREAST EXCISIONAL BIOPSY Left    CERVICAL BIOPSY  W/ LOOP ELECTRODE EXCISION     FOOT SURGERY     SVT ABLATION N/A 05/01/2020   Procedure: SVT ABLATION;  Surgeon: Thompson Grayer, MD;  Location: [facility];  Service: Cardiovascular;  Laterality: N/A;   thumb surgery     TUBAL LIGATION     WISDOM TOOTH EXTRACTION     Patient Active Problem List   Diagnosis Date Noted   SVT (supraventricular tachycardia) 02/27/2020   Vaginitis, atrophic 02/27/2020   Dyspareunia in female 02/27/2020   Chronic pelvic pain in female 02/27/2020   Well woman exam with routine gynecological exam 02/27/2020   Fibromyalgia 02/27/2020   Bipolar II disorder (HCC)    MDD (major depressive disorder), recurrent severe, without psychosis ([place]) 04/04/2019   MDD (major depressive disorder),  recurrent episode, moderate ([place]) 04/17/2017    REFERRING DIAG: M54.50 (ICD-10-CM) - Low back pain, unspecified    THERAPY DIAG:  Muscle weakness (generalized)  Other low back pain  Other abnormalities of gait and mobility  Rationale for Evaluation and Treatment Rehabilitation  PERTINENT HISTORY: SVT, Depression, Fibromyalgia    PRECAUTIONS: None  SUBJECTIVE:  SUBJECTIVE STATEMENT: Patient reports increased business at work the past couple of days, but that her back pain has not increased.    PAIN:  Are you having pain?  Yes: NPRS scale: 4/10 Worst: 10/10 Pain location: lower back, LE (L>R),  Pain description: sharp, N/T Aggravating factors: prolonged standing, prolonged sitting, walking Relieving factors: heat, medication    OBJECTIVE: (objective measures completed at initial evaluation unless otherwise dated)   DIAGNOSTIC FINDINGS:  See imaging    PATIENT SURVEYS:  ODI: 35/50 - 70% disability    COGNITION: Overall cognitive status: Within functional limits for tasks assessed                          SENSATION: Light touch: Impaired - decreased light touch L LE   POSTURE: rounded shoulders, forward head, and increased lumbar lordosis   MUSCLE LENGTH:            Thomas Test: (+)    PALPATION: TTP to bilateral lumbar paraspinals, L proximal hip    LUMBAR ROM:    AROM eval  Flexion Reduced with pain  Extension Reduced with pain  Right lateral flexion    Left lateral flexion    Right rotation Reduced with pain  Left rotation Reduced with pain   (Blank rows = not tested)   LOWER EXTREMITY MMT:     MMT Right eval Left eval  Hip flexion 4/5 3+/5  Hip extension      Hip abduction 3+/5 3+/5  Hip adduction      Hip internal rotation      Hip external rotation      Knee  flexion      Knee extension       (Blank rows = not tested)   LOWER EXTREMITY MYOTOMES:   Myotome Right Left  L2 4/5 3/5  L3 4/5 3/5  L4 4/5 3/5  L5 4/5 4/5  S1 4/5 4/5      LUMBAR SPECIAL TESTS:  Straight leg raise test: Positive, Slump test: Positive, Stork standing: Positive, SI Compression/distraction test: Positive, and Long sit test: Positive Left   FUNCTIONAL TESTS:  30 Second STS: 5 reps   GAIT: Distance walked: 52f Assistive device utilized: None Level of assistance: Complete Independence Comments: antalgic gait L LE, decreased gait speed   TREATMENT: [facility] Adult PT Treatment:                                                DATE: 05/13/22 Therapeutic Exercise: Nustep level 6 x 5 mins while gathering subjective and planning session with patient Palloff press 3# x10 BIL Supine PPT 2x10 5" hold Supine PPT with clam 2x10 BTB Supine PPT with marching against BTB x10 Supine 90/90 isometric hold 2x30" Bridge 2x10 STS 2 x 10 - low table 10# KB  [facility] Adult PT Treatment:                                                DATE: 05/08/22 Therapeutic Exercise: Nustep level 5 x 5 mins while gathering subjective and planning session with patient Supine PPT 2x10 5" hold Supine PT 2x10 - 5" hold Supine PPT with clam 2x10 BTB Bridge 2x10 STS  x 10 - low table no UE  [facility] Adult PT Treatment:                                                DATE: 05/06/22 Therapeutic Exercise: Nustep level 5 x 5 mins while gathering subjective and planning session with patient Palloff press 3# x10 BIL Standing hip abduction/extension RTB at ankles 2x10 BIL PPT 5" hold 2x10 Bridges 2x10 LTR x10 BIL Modified thomas stretch EOM x1' BIL    PATIENT EDUCATION:  Education details: eval findings, ODI, HEP, POC Person educated: Patient Education method: Explanation, Demonstration, and Handouts Education comprehension: verbalized understanding and returned demonstration   HOME EXERCISE PROGRAM: Access  Code: [email or web address] URL: [email or web address] Date: 04/30/2022 Prepared by: Octavio Manns   Exercises - Seated Sciatic Tensioner  - 1 x daily - 7 x weekly - 2 sets - 10 reps - Supine Posterior Pelvic Tilt  - 1 x daily - 7 x weekly - 2 sets - 10 reps - 3-5 sec hold - Modified Thomas Stretch  - 1 x daily - 7 x weekly - 2-3 sets - 60 sec hold   ASSESSMENT:   CLINICAL IMPRESSION: Patient presents to PT with improved pain today, stating that also her neck pain has improved since she got a massage over the weekend. Re-introduced palloff presses this session with patient reporting no increase in pain. Session today focused on core and proximal hip strengthening with increased resistance and repetitions as noted. Patient was able to tolerate all prescribed exercises with no adverse effects. Patient continues to benefit from skilled PT services and should be progressed as able to improve functional independence.     OBJECTIVE IMPAIRMENTS: decreased activity tolerance, decreased balance, decreased mobility, difficulty walking, decreased ROM, decreased strength, postural dysfunction, and pain.    ACTIVITY LIMITATIONS: carrying, lifting, bending, standing, squatting, stairs, and transfers   PARTICIPATION LIMITATIONS: driving, shopping, community activity, occupation, and yard work   PERSONAL FACTORS: Time since onset of injury/illness/exacerbation and 1-2 comorbidities: SVT, Depression, Fibromyalgia   are also affecting patient's functional outcome.     GOALS: Goals reviewed with patient? No   SHORT TERM GOALS: Target date: 05/21/2022   Pt will be compliant and knowledgeable with initial HEP for improved comfort and carryover Baseline: initial HEP given  Goal status: Ongoing Patient reports sporadic compliance 05/13/22   2.  Pt will self report back pain no greater than 7-8/10 for improved comfort and functional ability Baseline: 10/10 at worst Goal status: INITIAL    LONG TERM  GOALS: Target date: 06/25/2022     Pt will be decrease ODI disability score to no greater than 55% as proxy for functional improvement Baseline:  35/50 - 70% disability Goal status: INITIAL   2.  Pt will self report back pain no greater than 2-3/10 for improved comfort and functional ability Baseline: 10/10 at worst Goal status: INITIAL    3.  Pt will increase 30 Second Sit to Stand rep count to no less than 7 reps (MCID 2)for improved balance, strength, and functional mobility Baseline: 5 reps  Goal status: INITIAL    4.  Pt will improve bilateral LE MMT to no less than 4/5 for all tested motions for improved comfort and functional mobility with home ADLs and transfers Baseline: see chart Goal status: INITIAL   PLAN:   PT FREQUENCY: 2x/week  PT DURATION: 8 weeks   PLANNED INTERVENTIONS: Therapeutic exercises, Therapeutic activity, Neuromuscular re-education, Balance training, Gait training, Patient/Family education, Self Care, Joint mobilization, Aquatic Therapy, Dry Needling, Electrical stimulation, Cryotherapy, Moist heat, Manual therapy, and Re-evaluation.   PLAN FOR NEXT SESSION: assess HEP response, core and hip strengthening                 Margarette Canada, PTA 05/13/2022, 4:54 PM

## 2022-05-15 ENCOUNTER — Ambulatory Visit: Payer: Medicaid Other | Admitting: Physical Therapy

## 2022-05-15 ENCOUNTER — Encounter: Payer: Self-pay | Admitting: Physical Therapy

## 2022-05-15 ENCOUNTER — Ambulatory Visit: Payer: Medicaid Other

## 2022-05-15 DIAGNOSIS — R2689 Other abnormalities of gait and mobility: Secondary | ICD-10-CM

## 2022-05-15 DIAGNOSIS — M5459 Other low back pain: Secondary | ICD-10-CM | POA: Diagnosis not present

## 2022-05-15 DIAGNOSIS — M6281 Muscle weakness (generalized): Secondary | ICD-10-CM

## 2022-05-15 NOTE — Therapy (Signed)
OUTPATIENT PHYSICAL THERAPY TREATMENT NOTE   Patient Name: Chelsea Cherry MRN: [id number] DOB:03-23-1971, 52 y.o., female Today's Date: 05/15/2022  PCP: Simona Huh, NP  REFERRING PROVIDER: Simona Huh, NP   END OF SESSION:   PT End of Session - 05/15/22 1448     Visit Number 5    Number of Visits 17    Date for PT Re-Evaluation 06/25/22    Authorization Type [employer]    PT Start Time 1450    PT Stop Time 1528    PT Time Calculation (min) 38 min    Activity Tolerance Patient tolerated treatment well    Behavior During Therapy WFL for tasks assessed/performed               Past Medical History:  Diagnosis Date   Anxiety    Chronic headaches    Colon polyps    Depression    Fibroid    Fibromyalgia    Hyperlipidemia    IBS (irritable bowel syndrome)    Mitral valve prolapse    SVT (supraventricular tachycardia)    UTI (urinary tract infection)    Vaginal Pap smear, abnormal    Vitamin D deficiency    Past Surgical History:  Procedure Laterality Date   abdominal laproscopic surgery     BREAST BIOPSY Right    BREAST EXCISIONAL BIOPSY Right    x3   BREAST EXCISIONAL BIOPSY Left    CERVICAL BIOPSY  W/ LOOP ELECTRODE EXCISION     FOOT SURGERY     SVT ABLATION N/A 05/01/2020   Procedure: SVT ABLATION;  Surgeon: Thompson Grayer, MD;  Location: [facility];  Service: Cardiovascular;  Laterality: N/A;   thumb surgery     TUBAL LIGATION     WISDOM TOOTH EXTRACTION     Patient Active Problem List   Diagnosis Date Noted   SVT (supraventricular tachycardia) 02/27/2020   Vaginitis, atrophic 02/27/2020   Dyspareunia in female 02/27/2020   Chronic pelvic pain in female 02/27/2020   Well woman exam with routine gynecological exam 02/27/2020   Fibromyalgia 02/27/2020   Bipolar II disorder (HCC)    MDD (major depressive disorder), recurrent severe, without psychosis ([place]) 04/04/2019   MDD (major depressive disorder), recurrent episode, moderate  ([place]) 04/17/2017    REFERRING DIAG: M54.50 (ICD-10-CM) - Low back pain, unspecified    THERAPY DIAG:  Muscle weakness (generalized)  Other low back pain  Other abnormalities of gait and mobility  Rationale for Evaluation and Treatment Rehabilitation  PERTINENT HISTORY: SVT, Depression, Fibromyalgia    PRECAUTIONS: None  SUBJECTIVE:  SUBJECTIVE STATEMENT: Pt states that she has has had a busy week, but her low back is feeling good.   PAIN:  Are you having pain?  Yes: NPRS scale: 3/10 Worst: 10/10 Pain location: lower back, LE (L>R),  Pain description: sharp, N/T Aggravating factors: prolonged standing, prolonged sitting, walking Relieving factors: heat, medication    OBJECTIVE: (objective measures completed at initial evaluation unless otherwise dated)   DIAGNOSTIC FINDINGS:  See imaging    PATIENT SURVEYS:  ODI: 35/50 - 70% disability    COGNITION: Overall cognitive status: Within functional limits for tasks assessed                          SENSATION: Light touch: Impaired - decreased light touch L LE   POSTURE: rounded shoulders, forward head, and increased lumbar lordosis   MUSCLE LENGTH:            Thomas Test: (+)    PALPATION: TTP to bilateral lumbar paraspinals, L proximal hip    LUMBAR ROM:    AROM eval  Flexion Reduced with pain  Extension Reduced with pain  Right lateral flexion    Left lateral flexion    Right rotation Reduced with pain  Left rotation Reduced with pain   (Blank rows = not tested)   LOWER EXTREMITY MMT:     MMT Right eval Left eval  Hip flexion 4/5 3+/5  Hip extension      Hip abduction 3+/5 3+/5  Hip adduction      Hip internal rotation      Hip external rotation      Knee flexion      Knee extension       (Blank rows = not tested)    LOWER EXTREMITY MYOTOMES:   Myotome Right Left  L2 4/5 3/5  L3 4/5 3/5  L4 4/5 3/5  L5 4/5 4/5  S1 4/5 4/5      LUMBAR SPECIAL TESTS:  Straight leg raise test: Positive, Slump test: Positive, Stork standing: Positive, SI Compression/distraction test: Positive, and Long sit test: Positive Left   FUNCTIONAL TESTS:  30 Second STS: 5 reps   GAIT: Distance walked: 22f Assistive device utilized: None Level of assistance: Complete Independence Comments: antalgic gait L LE, decreased gait speed   TREATMENT:  TREATMENT 05/15/22:  [address] - therapeutic pool temp 92 degrees Pt enters building independently.  Treatment took place in water 3.8 to  4 ft 8 in.feet deep depending upon activity.  Pt entered and exited the pool via stair and handrails    Aquatic Therapy:  Water walking for warm up fwd/lat/bkwds  Standing: Cross-over walking - 2 laps Walking march - with blue noodle for support - 1 lap Hip flexor stretch on step (d/c d/t LBP even with pelvic tilt and good form) Hip abd/add x20 BIL Hip Circles CC/CCW 2x10 each BIL Heel raise x20 Single leg heel raises - x20  Core/shoulders: Rainbow dumbell push down - 2x10 Water bell push/pull in sitting Bil and alternating   Sitting on bench in water: Bicycle kicks x1' Reverse bicycle kicks x1' Flutter kicks x1' Scissor kicks x1'  Stretching: Hamstring stretch on bottom step x30" BIL (not today)  Pt requires the buoyancy of water for active assisted exercises with buoyancy supported for strengthening and AROM exercises. Hydrostatic pressure also supports joints by unweighting joint load by at least 50 % in 3-4 feet depth water. 80% in chest  to neck deep water. Water will provide assistance with movement using the current and laminar flow while the buoyancy reduces weight bearing. Pt requires the viscosity of the water for resistance with strengthening exercises.   [facility] Adult PT  Treatment:                                                DATE: 05/13/22 Therapeutic Exercise: Nustep level 6 x 5 mins while gathering subjective and planning session with patient Palloff press 3# x10 BIL Supine PPT 2x10 5" hold Supine PPT with clam 2x10 BTB Supine PPT with marching against BTB x10 Supine 90/90 isometric hold 2x30" Bridge 2x10 STS 2 x 10 - low table 10# KB  [facility] Adult PT Treatment:                                                DATE: 05/08/22 Therapeutic Exercise: Nustep level 5 x 5 mins while gathering subjective and planning session with patient Supine PPT 2x10 5" hold Supine PT 2x10 - 5" hold Supine PPT with clam 2x10 BTB Bridge 2x10 STS x 10 - low table no UE  [facility] Adult PT Treatment:                                                DATE: 05/06/22 Therapeutic Exercise: Nustep level 5 x 5 mins while gathering subjective and planning session with patient Palloff press 3# x10 BIL Standing hip abduction/extension RTB at ankles 2x10 BIL PPT 5" hold 2x10 Bridges 2x10 LTR x10 BIL Modified thomas stretch EOM x1' BIL    PATIENT EDUCATION:  Education details: eval findings, ODI, HEP, POC Person educated: Patient Education method: Explanation, Demonstration, and Handouts Education comprehension: verbalized understanding and returned demonstration   HOME EXERCISE PROGRAM: Access Code: [email or web address] URL: [email or web address] Date: 04/30/2022 Prepared by: Octavio Manns   Exercises - Seated Sciatic Tensioner  - 1 x daily - 7 x weekly - 2 sets - 10 reps - Supine Posterior Pelvic Tilt  - 1 x daily - 7 x weekly - 2 sets - 10 reps - 3-5 sec hold - Modified Thomas Stretch  - 1 x daily - 7 x weekly - 2-3 sets - 60 sec hold   ASSESSMENT:   CLINICAL IMPRESSION: Session today focused on hip and core strength in the aquatic environment for use of buoyancy to offload joints and the viscosity of water as resistance during therapeutic exercise.  Pt is slow and guarded  with movements but is able to complete with good form with verbal cues and demonstration.  LE's fatigue quickly, but after performing seated exerciess fatigue is diminished.  Patient was able to tolerate all prescribed exercises in the aquatic environment with no adverse effects and reports 2/10 pain at the end of the session. Patient continues to benefit from skilled PT services on land and aquatic based and should be progressed as able to improve functional independence.     OBJECTIVE IMPAIRMENTS: decreased activity tolerance, decreased balance, decreased mobility, difficulty walking, decreased ROM, decreased strength, postural dysfunction, and pain.    ACTIVITY LIMITATIONS: carrying,  lifting, bending, standing, squatting, stairs, and transfers   PARTICIPATION LIMITATIONS: driving, shopping, community activity, occupation, and yard work   PERSONAL FACTORS: Time since onset of injury/illness/exacerbation and 1-2 comorbidities: SVT, Depression, Fibromyalgia   are also affecting patient's functional outcome.     GOALS: Goals reviewed with patient? No   SHORT TERM GOALS: Target date: 05/21/2022   Pt will be compliant and knowledgeable with initial HEP for improved comfort and carryover Baseline: initial HEP given  Goal status: Ongoing Patient reports sporadic compliance 05/13/22   2.  Pt will self report back pain no greater than 7-8/10 for improved comfort and functional ability Baseline: 10/10 at worst Goal status: INITIAL    LONG TERM GOALS: Target date: 06/25/2022     Pt will be decrease ODI disability score to no greater than 55% as proxy for functional improvement Baseline:  35/50 - 70% disability Goal status: INITIAL   2.  Pt will self report back pain no greater than 2-3/10 for improved comfort and functional ability Baseline: 10/10 at worst Goal status: INITIAL    3.  Pt will increase 30 Second Sit to Stand rep count to no less than 7 reps (MCID 2)for improved balance,  strength, and functional mobility Baseline: 5 reps  Goal status: INITIAL    4.  Pt will improve bilateral LE MMT to no less than 4/5 for all tested motions for improved comfort and functional mobility with home ADLs and transfers Baseline: see chart Goal status: INITIAL   PLAN:   PT FREQUENCY: 2x/week   PT DURATION: 8 weeks   PLANNED INTERVENTIONS: Therapeutic exercises, Therapeutic activity, Neuromuscular re-education, Balance training, Gait training, Patient/Family education, Self Care, Joint mobilization, Aquatic Therapy, Dry Needling, Electrical stimulation, Cryotherapy, Moist heat, Manual therapy, and Re-evaluation.   PLAN FOR NEXT SESSION: assess HEP response, core and hip strengthening                 Mathis Dad, PT 05/15/2022, 3:37 PM

## 2022-05-20 ENCOUNTER — Ambulatory Visit: Payer: Medicaid Other

## 2022-05-20 DIAGNOSIS — M5459 Other low back pain: Secondary | ICD-10-CM

## 2022-05-20 DIAGNOSIS — R2689 Other abnormalities of gait and mobility: Secondary | ICD-10-CM

## 2022-05-20 DIAGNOSIS — M6281 Muscle weakness (generalized): Secondary | ICD-10-CM

## 2022-05-20 NOTE — Therapy (Signed)
OUTPATIENT PHYSICAL THERAPY TREATMENT NOTE   Patient Name: Chelsea Cherry MRN: [id number] DOB:1970/04/12, 52 y.o., female Today's Date: 05/20/2022  PCP: Simona Huh, NP  REFERRING PROVIDER: Simona Huh, NP   END OF SESSION:   PT End of Session - 05/20/22 1741     Visit Number 6    Number of Visits 17    Date for PT Re-Evaluation 06/25/22    Authorization Type [employer]    Authorization Time Period 9 visits approved 05/01/22-06/29/22    Authorization - Visit Number 5    Authorization - Number of Visits 9    PT Start Time [id number]    PT Stop Time 1826    PT Time Calculation (min) 41 min    Activity Tolerance Patient tolerated treatment well    Behavior During Therapy WFL for tasks assessed/performed              Past Medical History:  Diagnosis Date   Anxiety    Chronic headaches    Colon polyps    Depression    Fibroid    Fibromyalgia    Hyperlipidemia    IBS (irritable bowel syndrome)    Mitral valve prolapse    SVT (supraventricular tachycardia)    UTI (urinary tract infection)    Vaginal Pap smear, abnormal    Vitamin D deficiency    Past Surgical History:  Procedure Laterality Date   abdominal laproscopic surgery     BREAST BIOPSY Right    BREAST EXCISIONAL BIOPSY Right    x3   BREAST EXCISIONAL BIOPSY Left    CERVICAL BIOPSY  W/ LOOP ELECTRODE EXCISION     FOOT SURGERY     SVT ABLATION N/A 05/01/2020   Procedure: SVT ABLATION;  Surgeon: Thompson Grayer, MD;  Location: [facility];  Service: Cardiovascular;  Laterality: N/A;   thumb surgery     TUBAL LIGATION     WISDOM TOOTH EXTRACTION     Patient Active Problem List   Diagnosis Date Noted   SVT (supraventricular tachycardia) 02/27/2020   Vaginitis, atrophic 02/27/2020   Dyspareunia in female 02/27/2020   Chronic pelvic pain in female 02/27/2020   Well woman exam with routine gynecological exam 02/27/2020   Fibromyalgia 02/27/2020   Bipolar II disorder (HCC)    MDD (major  depressive disorder), recurrent severe, without psychosis ([place]) 04/04/2019   MDD (major depressive disorder), recurrent episode, moderate ([place]) 04/17/2017    REFERRING DIAG: M54.50 (ICD-10-CM) - Low back pain, unspecified    THERAPY DIAG:  Muscle weakness (generalized)  Other low back pain  Other abnormalities of gait and mobility  Rationale for Evaluation and Treatment Rehabilitation  PERTINENT HISTORY: SVT, Depression, Fibromyalgia    PRECAUTIONS: None  SUBJECTIVE:  SUBJECTIVE STATEMENT: Patient reports that she was a bit sore after the aquatic session, but that her pain is ok today.   PAIN:  Are you having pain?  Yes: NPRS scale: 4/10 Worst: 10/10 Pain location: lower back, LE (L>R),  Pain description: sharp, N/T Aggravating factors: prolonged standing, prolonged sitting, walking Relieving factors: heat, medication    OBJECTIVE: (objective measures completed at initial evaluation unless otherwise dated)   DIAGNOSTIC FINDINGS:  See imaging    PATIENT SURVEYS:  ODI: 35/50 - 70% disability    COGNITION: Overall cognitive status: Within functional limits for tasks assessed                          SENSATION: Light touch: Impaired - decreased light touch L LE   POSTURE: rounded shoulders, forward head, and increased lumbar lordosis   MUSCLE LENGTH:            Thomas Test: (+)    PALPATION: TTP to bilateral lumbar paraspinals, L proximal hip    LUMBAR ROM:    AROM eval  Flexion Reduced with pain  Extension Reduced with pain  Right lateral flexion    Left lateral flexion    Right rotation Reduced with pain  Left rotation Reduced with pain   (Blank rows = not tested)   LOWER EXTREMITY MMT:     MMT Right eval Left eval  Hip flexion 4/5 3+/5  Hip extension      Hip  abduction 3+/5 3+/5  Hip adduction      Hip internal rotation      Hip external rotation      Knee flexion      Knee extension       (Blank rows = not tested)   LOWER EXTREMITY MYOTOMES:   Myotome Right Left  L2 4/5 3/5  L3 4/5 3/5  L4 4/5 3/5  L5 4/5 4/5  S1 4/5 4/5      LUMBAR SPECIAL TESTS:  Straight leg raise test: Positive, Slump test: Positive, Stork standing: Positive, SI Compression/distraction test: Positive, and Long sit test: Positive Left   FUNCTIONAL TESTS:  30 Second STS: 5 reps   GAIT: Distance walked: 79f Assistive device utilized: None Level of assistance: Complete Independence Comments: antalgic gait L LE, decreased gait speed   TREATMENT: [facility] Adult PT Treatment:                                                DATE: 05/20/22 Therapeutic Exercise: Nustep level 6 x 5 mins while gathering subjective and planning session with patient Palloff press 7# x10 BIL Sidestepping at counter RTB at ankles x3 laps  Supine figure 4 piriformis stretch push/pull Lt x45" each Supine sciatic nerve glide x10 Lt LTR x10 BIL Sidelying open books x10 BIL Supine 90/90 isometric hold 2x30" Supine 90/90 with alternating heel tap x5 BIL (pain in R hip flexor, tingling down RLE) Bridge with ball 2x10 SI joint correction hooklying (painful)   TREATMENT 05/15/22:  Aquatic therapy at [address] - therapeutic pool temp 92 degrees Pt enters building independently.  Treatment took place in water 3.8 to  4 ft 8 in.feet deep depending upon activity.  Pt entered and exited the pool via stair and handrails    Aquatic Therapy:  Water walking for warm up fwd/lat/bkwds  Standing: Cross-over walking - 2 laps Walking march - with blue noodle for support - 1 lap Hip flexor stretch on step (d/c d/t LBP even with pelvic tilt and good form) Hip abd/add x20 BIL Hip Circles CC/CCW 2x10 each BIL Heel raise x20 Single leg heel raises - x20  Core/shoulders: Rainbow  dumbell push down - 2x10 Water bell push/pull in sitting Bil and alternating   Sitting on bench in water: Bicycle kicks x1' Reverse bicycle kicks x1' Flutter kicks x1' Scissor kicks x1'  Stretching: Hamstring stretch on bottom step x30" BIL (not today)  Pt requires the buoyancy of water for active assisted exercises with buoyancy supported for strengthening and AROM exercises. Hydrostatic pressure also supports joints by unweighting joint load by at least 50 % in 3-4 feet depth water. 80% in chest to neck deep water. Water will provide assistance with movement using the current and laminar flow while the buoyancy reduces weight bearing. Pt requires the viscosity of the water for resistance with strengthening exercises.   [facility] Adult PT Treatment:                                                DATE: 05/13/22 Therapeutic Exercise: Nustep level 6 x 5 mins while gathering subjective and planning session with patient Palloff press 3# x10 BIL Supine PPT 2x10 5" hold Supine PPT with clam 2x10 BTB Supine PPT with marching against BTB x10 Supine 90/90 isometric hold 2x30" Bridge 2x10 STS 2 x 10 - low table 10# KB     PATIENT EDUCATION:  Education details: eval findings, ODI, HEP, POC Person educated: Patient Education method: Explanation, Demonstration, and Handouts Education comprehension: verbalized understanding and returned demonstration   HOME EXERCISE PROGRAM: Access Code: [email or web address] URL: [email or web address] Date: 04/30/2022 Prepared by: Octavio Manns   Exercises - Seated Sciatic Tensioner  - 1 x daily - 7 x weekly - 2 sets - 10 reps - Supine Posterior Pelvic Tilt  - 1 x daily - 7 x weekly - 2 sets - 10 reps - 3-5 sec hold - Modified Thomas Stretch  - 1 x daily - 7 x weekly - 2-3 sets - 60 sec hold   ASSESSMENT:   CLINICAL IMPRESSION: Patient presents to PT with continued reports of lower back pain and that her first aquatic session went well. Session today  continued to focus on core and proximal hip strengthening with increased resistance as noted above to good affect. Patient was able to tolerate all prescribed exercises with no adverse effects. Patient continues to benefit from skilled PT services and should be progressed as able to improve functional independence.     OBJECTIVE IMPAIRMENTS: decreased activity tolerance, decreased balance, decreased mobility, difficulty walking, decreased ROM, decreased strength, postural dysfunction, and pain.    ACTIVITY LIMITATIONS: carrying, lifting, bending, standing, squatting, stairs, and transfers   PARTICIPATION LIMITATIONS: driving, shopping, community activity, occupation, and yard work   PERSONAL FACTORS: Time since onset of injury/illness/exacerbation and 1-2 comorbidities: SVT, Depression, Fibromyalgia   are also affecting patient's functional outcome.     GOALS: Goals reviewed with patient? No   SHORT TERM GOALS: Target date: 05/21/2022   Pt will be compliant and knowledgeable with initial HEP for improved comfort and carryover Baseline: initial HEP given  Goal status: Ongoing Patient reports sporadic compliance 05/13/22   2.  Pt will self report back  pain no greater than 7-8/10 for improved comfort and functional ability Baseline: 10/10 at worst Goal status: INITIAL    LONG TERM GOALS: Target date: 06/25/2022     Pt will be decrease ODI disability score to no greater than 55% as proxy for functional improvement Baseline:  35/50 - 70% disability Goal status: INITIAL   2.  Pt will self report back pain no greater than 2-3/10 for improved comfort and functional ability Baseline: 10/10 at worst Goal status: INITIAL    3.  Pt will increase 30 Second Sit to Stand rep count to no less than 7 reps (MCID 2)for improved balance, strength, and functional mobility Baseline: 5 reps  Goal status: INITIAL    4.  Pt will improve bilateral LE MMT to no less than 4/5 for all tested motions for  improved comfort and functional mobility with home ADLs and transfers Baseline: see chart Goal status: INITIAL   PLAN:   PT FREQUENCY: 2x/week   PT DURATION: 8 weeks   PLANNED INTERVENTIONS: Therapeutic exercises, Therapeutic activity, Neuromuscular re-education, Balance training, Gait training, Patient/Family education, Self Care, Joint mobilization, Aquatic Therapy, Dry Needling, Electrical stimulation, Cryotherapy, Moist heat, Manual therapy, and Re-evaluation.   PLAN FOR NEXT SESSION: assess HEP response, core and hip strengthening                 Margarette Canada, PTA 05/20/2022, 6:26 PM

## 2022-05-22 ENCOUNTER — Encounter: Payer: Self-pay | Admitting: Physical Therapy

## 2022-05-22 ENCOUNTER — Ambulatory Visit: Payer: Medicaid Other | Admitting: Physical Therapy

## 2022-05-22 DIAGNOSIS — R2689 Other abnormalities of gait and mobility: Secondary | ICD-10-CM

## 2022-05-22 DIAGNOSIS — M6281 Muscle weakness (generalized): Secondary | ICD-10-CM

## 2022-05-22 DIAGNOSIS — M5459 Other low back pain: Secondary | ICD-10-CM | POA: Diagnosis not present

## 2022-05-22 NOTE — Therapy (Signed)
OUTPATIENT PHYSICAL THERAPY TREATMENT NOTE   Patient Name: Nivaya Poteete MRN: [id number] DOB:January 07, 1971, 52 y.o., female Today's Date: 05/22/2022  PCP: Simona Huh, NP  REFERRING PROVIDER: Simona Huh, NP   END OF SESSION:   PT End of Session - 05/22/22 1356     Visit Number 7    Number of Visits 17    Date for PT Re-Evaluation 06/25/22    Authorization Type [employer]    Authorization Time Period 9 visits approved 05/01/22-06/29/22    Authorization - Visit Number 6    Authorization - Number of Visits 9    Activity Tolerance Patient tolerated treatment well    Behavior During Therapy [facility] for tasks assessed/performed               Past Medical History:  Diagnosis Date   Anxiety    Chronic headaches    Colon polyps    Depression    Fibroid    Fibromyalgia    Hyperlipidemia    IBS (irritable bowel syndrome)    Mitral valve prolapse    SVT (supraventricular tachycardia)    UTI (urinary tract infection)    Vaginal Pap smear, abnormal    Vitamin D deficiency    Past Surgical History:  Procedure Laterality Date   abdominal laproscopic surgery     BREAST BIOPSY Right    BREAST EXCISIONAL BIOPSY Right    x3   BREAST EXCISIONAL BIOPSY Left    CERVICAL BIOPSY  W/ LOOP ELECTRODE EXCISION     FOOT SURGERY     SVT ABLATION N/A 05/01/2020   Procedure: SVT ABLATION;  Surgeon: Thompson Grayer, MD;  Location: [facility];  Service: Cardiovascular;  Laterality: N/A;   thumb surgery     TUBAL LIGATION     WISDOM TOOTH EXTRACTION     Patient Active Problem List   Diagnosis Date Noted   SVT (supraventricular tachycardia) 02/27/2020   Vaginitis, atrophic 02/27/2020   Dyspareunia in female 02/27/2020   Chronic pelvic pain in female 02/27/2020   Well woman exam with routine gynecological exam 02/27/2020   Fibromyalgia 02/27/2020   Bipolar II disorder (HCC)    MDD (major depressive disorder), recurrent severe, without psychosis ([place]) 04/04/2019   MDD  (major depressive disorder), recurrent episode, moderate ([place]) 04/17/2017    REFERRING DIAG: M54.50 (ICD-10-CM) - Low back pain, unspecified    THERAPY DIAG:  Muscle weakness (generalized)  Other low back pain  Other abnormalities of gait and mobility  Rationale for Evaluation and Treatment Rehabilitation  PERTINENT HISTORY: SVT, Depression, Fibromyalgia    PRECAUTIONS: None  SUBJECTIVE:  SUBJECTIVE STATEMENT:   Pt reports that AT went well last session.  Her abdominals are sore after her last land based visit   PAIN:  Are you having pain?  Yes: NPRS scale: 3/10 Worst: 10/10 Pain location: lower back, LE (L>R),  Pain description: sharp, N/T Aggravating factors: prolonged standing, prolonged sitting, walking Relieving factors: heat, medication    OBJECTIVE: (objective measures completed at initial evaluation unless otherwise dated)   DIAGNOSTIC FINDINGS:  See imaging    PATIENT SURVEYS:  ODI: 35/50 - 70% disability    COGNITION: Overall cognitive status: Within functional limits for tasks assessed                          SENSATION: Light touch: Impaired - decreased light touch L LE   POSTURE: rounded shoulders, forward head, and increased lumbar lordosis   MUSCLE LENGTH:            Thomas Test: (+)    PALPATION: TTP to bilateral lumbar paraspinals, L proximal hip    LUMBAR ROM:    AROM eval  Flexion Reduced with pain  Extension Reduced with pain  Right lateral flexion    Left lateral flexion    Right rotation Reduced with pain  Left rotation Reduced with pain   (Blank rows = not tested)   LOWER EXTREMITY MMT:     MMT Right eval Left eval  Hip flexion 4/5 3+/5  Hip extension      Hip abduction 3+/5 3+/5  Hip adduction      Hip internal rotation      Hip external  rotation      Knee flexion      Knee extension       (Blank rows = not tested)   LOWER EXTREMITY MYOTOMES:   Myotome Right Left  L2 4/5 3/5  L3 4/5 3/5  L4 4/5 3/5  L5 4/5 4/5  S1 4/5 4/5      LUMBAR SPECIAL TESTS:  Straight leg raise test: Positive, Slump test: Positive, Stork standing: Positive, SI Compression/distraction test: Positive, and Long sit test: Positive Left   FUNCTIONAL TESTS:  30 Second STS: 5 reps   GAIT: Distance walked: 73f Assistive device utilized: None Level of assistance: Complete Independence Comments: antalgic gait L LE, decreased gait speed   TREATMENT:   TREATMENT 05/22/22:   Aquatic therapy at [address] - therapeutic pool temp 92 degrees Pt enters building independently.  Treatment took place in water 3.8 to  4 ft 8 in.feet deep depending upon activity.  Pt entered and exited the pool via stair and handrails     Aquatic Therapy:   Water walking for warm up fwd/lat/bkwds   Standing: Cross-over walking - 2 laps Walking march - with blue noodle for support - 1 lap Hip abd/add 2x15 ea BIL Hip Circles CC/CCW 2x10 each BIL Heel/toe raise 2x20 Single leg heel raises - x20   Core/shoulders: Rainbow dumbell push down - 2x15 Yellow kickboard push/pull - 2x10     Sitting on bench in water: Bicycle kicks x1' Reverse bicycle kicks x1' Flutter kicks x1' Scissor kicks x1'   Stretching: Hamstring stretch on bottom step x30" BIL (not today)   Pt requires the buoyancy of water for active assisted exercises with buoyancy supported for strengthening and AROM exercises. Hydrostatic pressure also supports joints by unweighting joint load by at least 50 % in 3-4 feet depth water. 80% in chest to neck deep  water. Water will provide assistance with movement using the current and laminar flow while the buoyancy reduces weight bearing. Pt requires the viscosity of the water for resistance with strengthening exercises.  [facility] Adult PT  Treatment:                                                DATE: 05/20/22 Therapeutic Exercise: Nustep level 6 x 5 mins while gathering subjective and planning session with patient Palloff press 7# x10 BIL Sidestepping at counter RTB at ankles x3 laps  Supine figure 4 piriformis stretch push/pull Lt x45" each Supine sciatic nerve glide x10 Lt LTR x10 BIL Sidelying open books x10 BIL Supine 90/90 isometric hold 2x30" Supine 90/90 with alternating heel tap x5 BIL (pain in R hip flexor, tingling down RLE) Bridge with ball 2x10 SI joint correction hooklying (painful)   TREATMENT 05/15/22:  Aquatic therapy at [address] - therapeutic pool temp 92 degrees Pt enters building independently.  Treatment took place in water 3.8 to  4 ft 8 in.feet deep depending upon activity.  Pt entered and exited the pool via stair and handrails    Aquatic Therapy:  Water walking for warm up fwd/lat/bkwds  Standing: Cross-over walking - 2 laps Walking march - with blue noodle for support - 1 lap Hip flexor stretch on step (d/c d/t LBP even with pelvic tilt and good form) Hip abd/add x20 BIL Hip Circles CC/CCW 2x10 each BIL Heel raise x20 Single leg heel raises - x20  Core/shoulders: Rainbow dumbell push down - 2x10 Water bell push/pull in sitting Bil and alternating   Sitting on bench in water: Bicycle kicks x1' Reverse bicycle kicks x1' Flutter kicks x1' Scissor kicks x1'  Stretching: Hamstring stretch on bottom step x30" BIL (not today)  Pt requires the buoyancy of water for active assisted exercises with buoyancy supported for strengthening and AROM exercises. Hydrostatic pressure also supports joints by unweighting joint load by at least 50 % in 3-4 feet depth water. 80% in chest to neck deep water. Water will provide assistance with movement using the current and laminar flow while the buoyancy reduces weight bearing. Pt requires the viscosity of the water for resistance  with strengthening exercises.   [facility] Adult PT Treatment:                                                DATE: 05/13/22 Therapeutic Exercise: Nustep level 6 x 5 mins while gathering subjective and planning session with patient Palloff press 3# x10 BIL Supine PPT 2x10 5" hold Supine PPT with clam 2x10 BTB Supine PPT with marching against BTB x10 Supine 90/90 isometric hold 2x30" Bridge 2x10 STS 2 x 10 - low table 10# KB     PATIENT EDUCATION:  Education details: eval findings, ODI, HEP, POC Person educated: Patient Education method: Explanation, Demonstration, and Handouts Education comprehension: verbalized understanding and returned demonstration   HOME EXERCISE PROGRAM: Access Code: [email or web address] URL: [email or web address] Date: 04/30/2022 Prepared by: Octavio Manns   Exercises - Seated Sciatic Tensioner  - 1 x daily - 7 x weekly - 2 sets - 10 reps - Supine Posterior Pelvic Tilt  - 1 x daily - 7 x weekly - 2  sets - 10 reps - 3-5 sec hold - Modified Thomas Stretch  - 1 x daily - 7 x weekly - 2-3 sets - 60 sec hold   ASSESSMENT:   CLINICAL IMPRESSION: Session today focused on hip and core strength in the aquatic environment for use of buoyancy to offload joints and the viscosity of water as resistance during therapeutic exercise.  Able to increase overall volume today.  Pt a bit hesitant with some core based exercises d/t DOMs from last land based therapy, but was able to complete all listed exercises with good form.  Patient was able to tolerate all prescribed exercises in the aquatic environment with no adverse effects and reports 2/10 pain at the end of the session. Patient continues to benefit from skilled PT services on land and aquatic based and should be progressed as able to improve functional independence.      OBJECTIVE IMPAIRMENTS: decreased activity tolerance, decreased balance, decreased mobility, difficulty walking, decreased ROM, decreased strength, postural  dysfunction, and pain.    ACTIVITY LIMITATIONS: carrying, lifting, bending, standing, squatting, stairs, and transfers   PARTICIPATION LIMITATIONS: driving, shopping, community activity, occupation, and yard work   PERSONAL FACTORS: Time since onset of injury/illness/exacerbation and 1-2 comorbidities: SVT, Depression, Fibromyalgia   are also affecting patient's functional outcome.     GOALS: Goals reviewed with patient? No   SHORT TERM GOALS: Target date: 05/21/2022   Pt will be compliant and knowledgeable with initial HEP for improved comfort and carryover Baseline: initial HEP given  Goal status: Ongoing Patient reports sporadic compliance 05/13/22   2.  Pt will self report back pain no greater than 7-8/10 for improved comfort and functional ability Baseline: 10/10 at worst Goal status: INITIAL    LONG TERM GOALS: Target date: 06/25/2022     Pt will be decrease ODI disability score to no greater than 55% as proxy for functional improvement Baseline:  35/50 - 70% disability Goal status: INITIAL   2.  Pt will self report back pain no greater than 2-3/10 for improved comfort and functional ability Baseline: 10/10 at worst Goal status: INITIAL    3.  Pt will increase 30 Second Sit to Stand rep count to no less than 7 reps (MCID 2)for improved balance, strength, and functional mobility Baseline: 5 reps  Goal status: INITIAL    4.  Pt will improve bilateral LE MMT to no less than 4/5 for all tested motions for improved comfort and functional mobility with home ADLs and transfers Baseline: see chart Goal status: INITIAL   PLAN:   PT FREQUENCY: 2x/week   PT DURATION: 8 weeks   PLANNED INTERVENTIONS: Therapeutic exercises, Therapeutic activity, Neuromuscular re-education, Balance training, Gait training, Patient/Family education, Self Care, Joint mobilization, Aquatic Therapy, Dry Needling, Electrical stimulation, Cryotherapy, Moist heat, Manual therapy, and Re-evaluation.    PLAN FOR NEXT SESSION: assess HEP response, core and hip strengthening                 Mathis Dad, PT 05/22/2022, 2:47 PM

## 2022-05-26 NOTE — Therapy (Incomplete)
OUTPATIENT PHYSICAL THERAPY TREATMENT NOTE   Patient Name: Chelsea Cherry MRN: [id number] DOB:[birth year], 52 y.o., female Today's Date: 05/26/2022  PCP: Simona Huh, NP  REFERRING PROVIDER: Simona Huh, NP   END OF SESSION:       Past Medical History:  Diagnosis Date   Anxiety    Chronic headaches    Colon polyps    Depression    Fibroid    Fibromyalgia    Hyperlipidemia    IBS (irritable bowel syndrome)    Mitral valve prolapse    SVT (supraventricular tachycardia)    UTI (urinary tract infection)    Vaginal Pap smear, abnormal    Vitamin D deficiency    Past Surgical History:  Procedure Laterality Date   abdominal laproscopic surgery     BREAST BIOPSY Right    BREAST EXCISIONAL BIOPSY Right    x3   BREAST EXCISIONAL BIOPSY Left    CERVICAL BIOPSY  W/ LOOP ELECTRODE EXCISION     FOOT SURGERY     SVT ABLATION N/A 05/01/2020   Procedure: SVT ABLATION;  Surgeon: Thompson Grayer, MD;  Location: [facility];  Service: Cardiovascular;  Laterality: N/A;   thumb surgery     TUBAL LIGATION     WISDOM TOOTH EXTRACTION     Patient Active Problem List   Diagnosis Date Noted   SVT (supraventricular tachycardia) 02/27/2020   Vaginitis, atrophic 02/27/2020   Dyspareunia in female 02/27/2020   Chronic pelvic pain in female 02/27/2020   Well woman exam with routine gynecological exam 02/27/2020   Fibromyalgia 02/27/2020   Bipolar II disorder (HCC)    MDD (major depressive disorder), recurrent severe, without psychosis ([place]) 04/04/2019   MDD (major depressive disorder), recurrent episode, moderate ([place]) 04/17/2017    REFERRING DIAG: M54.50 (ICD-10-CM) - Low back pain, unspecified    THERAPY DIAG:  No diagnosis found.  Rationale for Evaluation and Treatment Rehabilitation  PERTINENT HISTORY: SVT, Depression, Fibromyalgia    PRECAUTIONS: None  SUBJECTIVE:                                                                                                                                                                                       SUBJECTIVE STATEMENT: ***   PAIN:  Are you having pain?  Yes: NPRS scale: 3/10 Worst: 10/10 Pain location: lower back, LE (L>R),  Pain description: sharp, N/T Aggravating factors: prolonged standing, prolonged sitting, walking Relieving factors: heat, medication    OBJECTIVE: (objective measures completed at initial evaluation unless otherwise dated)   DIAGNOSTIC FINDINGS:  See imaging    PATIENT SURVEYS:  ODI: 35/50 - 70% disability    COGNITION: Overall cognitive  status: Within functional limits for tasks assessed                          SENSATION: Light touch: Impaired - decreased light touch L LE   POSTURE: rounded shoulders, forward head, and increased lumbar lordosis   MUSCLE LENGTH:            Thomas Test: (+)    PALPATION: TTP to bilateral lumbar paraspinals, L proximal hip    LUMBAR ROM:    AROM eval  Flexion Reduced with pain  Extension Reduced with pain  Right lateral flexion    Left lateral flexion    Right rotation Reduced with pain  Left rotation Reduced with pain   (Blank rows = not tested)   LOWER EXTREMITY MMT:     MMT Right eval Left eval  Hip flexion 4/5 3+/5  Hip extension      Hip abduction 3+/5 3+/5  Hip adduction      Hip internal rotation      Hip external rotation      Knee flexion      Knee extension       (Blank rows = not tested)   LOWER EXTREMITY MYOTOMES:   Myotome Right Left  L2 4/5 3/5  L3 4/5 3/5  L4 4/5 3/5  L5 4/5 4/5  S1 4/5 4/5      LUMBAR SPECIAL TESTS:  Straight leg raise test: Positive, Slump test: Positive, Stork standing: Positive, SI Compression/distraction test: Positive, and Long sit test: Positive Left   FUNCTIONAL TESTS:  30 Second STS: 5 reps   GAIT: Distance walked: 44f Assistive device utilized: None Level of assistance: Complete [place] Comments: antalgic gait L LE, decreased gait speed    TREATMENT:   TREATMENT 05/22/22:   [address] - therapeutic pool temp 92 degrees Pt enters building independently.  Treatment took place in water 3.8 to  4 ft 8 in.feet deep depending upon activity.  Pt entered and exited the pool via stair and handrails     Aquatic Therapy:   Water walking for warm up fwd/lat/bkwds   Standing: Cross-over walking - 2 laps Walking march - with blue noodle for support - 1 lap Hip abd/add 2x15 ea BIL Hip Circles CC/CCW 2x10 each BIL Heel/toe raise 2x20 Single leg heel raises - x20   Core/shoulders: Rainbow dumbell push down - 2x15 Yellow kickboard push/pull - 2x10     Sitting on bench in water: Bicycle kicks x1' Reverse bicycle kicks x1' Flutter kicks x1' Scissor kicks x1'   Stretching: Hamstring stretch on bottom step x30" BIL (not today)   Pt requires the buoyancy of water for active assisted exercises with buoyancy supported for strengthening and AROM exercises. Hydrostatic pressure also supports joints by unweighting joint load by at least 50 % in 3-4 feet depth water. 80% in chest to neck deep water. Water will provide assistance with movement using the current and laminar flow while the buoyancy reduces weight bearing. Pt requires the viscosity of the water for resistance with strengthening exercises.  [facility]                                                DATE: 05/20/22 Therapeutic Exercise: Nustep level 6 x 5 mins while gathering subjective and  planning session with patient Palloff press 7# x10 BIL Sidestepping at counter RTB at ankles x3 laps  Supine figure 4 piriformis stretch push/pull Lt x45" each Supine sciatic nerve glide x10 Lt LTR x10 BIL Sidelying open books x10 BIL Supine 90/90 isometric hold 2x30" Supine 90/90 with alternating heel tap x5 BIL (pain in R hip flexor, tingling down RLE) Bridge with ball 2x10 SI joint correction hooklying (painful)   TREATMENT  05/15/22:  Aquatic therapy at [address] - therapeutic pool temp 92 degrees Pt enters building independently.  Treatment took place in water 3.8 to  4 ft 8 in.feet deep depending upon activity.  Pt entered and exited the pool via stair and handrails    Aquatic Therapy:  Water walking for warm up fwd/lat/bkwds  Standing: Cross-over walking - 2 laps Walking march - with blue noodle for support - 1 lap Hip flexor stretch on step (d/c d/t LBP even with pelvic tilt and good form) Hip abd/add x20 BIL Hip Circles CC/CCW 2x10 each BIL Heel raise x20 Single leg heel raises - x20  Core/shoulders: Rainbow dumbell push down - 2x10 Water bell push/pull in sitting Bil and alternating   Sitting on bench in water: Bicycle kicks x1' Reverse bicycle kicks x1' Flutter kicks x1' Scissor kicks x1'  Stretching: Hamstring stretch on bottom step x30" BIL (not today)  Pt requires the buoyancy of water for active assisted exercises with buoyancy supported for strengthening and AROM exercises. Hydrostatic pressure also supports joints by unweighting joint load by at least 50 % in 3-4 feet depth water. 80% in chest to neck deep water. Water will provide assistance with movement using the current and laminar flow while the buoyancy reduces weight bearing. Pt requires the viscosity of the water for resistance with strengthening exercises.   [place] PT Treatment:                                                DATE: 05/13/22 Therapeutic Exercise: Nustep level 6 x 5 mins while gathering subjective and planning session with patient Palloff press 3# x10 BIL Supine PPT 2x10 5" hold Supine PPT with clam 2x10 BTB Supine PPT with marching against BTB x10 Supine 90/90 isometric hold 2x30" Bridge 2x10 STS 2 x 10 - low table 10# KB     PATIENT EDUCATION:  Education details: eval findings, ODI, HEP, POC Person educated: Patient Education method: Explanation, Demonstration, and  Handouts Education comprehension: verbalized understanding and returned demonstration   HOME EXERCISE PROGRAM: Access Code: [email or web address] URL: [email or web address] Date: 04/30/2022 Prepared by: Octavio Manns   Exercises - Seated Sciatic Tensioner  - 1 x daily - 7 x weekly - 2 sets - 10 reps - Supine Posterior Pelvic Tilt  - 1 x daily - 7 x weekly - 2 sets - 10 reps - 3-5 sec hold - Modified Thomas Stretch  - 1 x daily - 7 x weekly - 2-3 sets - 60 sec hold   ASSESSMENT:   CLINICAL IMPRESSION: ***    OBJECTIVE IMPAIRMENTS: decreased activity tolerance, decreased balance, decreased mobility, difficulty walking, decreased ROM, decreased strength, postural dysfunction, and pain.    ACTIVITY LIMITATIONS: carrying, lifting, bending, standing, squatting, stairs, and transfers   PARTICIPATION LIMITATIONS: driving, shopping, community activity, occupation, and yard work   PERSONAL FACTORS: Time since onset of injury/illness/exacerbation and 1-2 comorbidities: SVT, Depression,  Fibromyalgia   are also affecting patient's functional outcome.     GOALS: Goals reviewed with patient? No   SHORT TERM GOALS: Target date: 05/21/2022   Pt will be compliant and knowledgeable with initial HEP for improved comfort and carryover Baseline: initial HEP given  Goal status: Ongoing Patient reports sporadic compliance 05/13/22   2.  Pt will self report back pain no greater than 7-8/10 for improved comfort and functional ability Baseline: 10/10 at worst Goal status: INITIAL    LONG TERM GOALS: Target date: 06/25/2022     Pt will be decrease ODI disability score to no greater than 55% as proxy for functional improvement Baseline:  35/50 - 70% disability Goal status: INITIAL   2.  Pt will self report back pain no greater than 2-3/10 for improved comfort and functional ability Baseline: 10/10 at worst Goal status: INITIAL    3.  Pt will increase 30 Second Sit to Stand rep count to no less  than 7 reps (MCID 2)for improved balance, strength, and functional mobility Baseline: 5 reps  Goal status: INITIAL    4.  Pt will improve bilateral LE MMT to no less than 4/5 for all tested motions for improved comfort and functional mobility with home ADLs and transfers Baseline: see chart Goal status: INITIAL   PLAN:   PT FREQUENCY: 2x/week   PT DURATION: 8 weeks   PLANNED INTERVENTIONS: Therapeutic exercises, Therapeutic activity, Neuromuscular re-education, Balance training, Gait training, Patient/Family education, Self Care, Joint mobilization, Aquatic Therapy, Dry Needling, Electrical stimulation, Cryotherapy, Moist heat, Manual therapy, and Re-evaluation.   PLAN FOR NEXT SESSION: assess HEP response, core and hip strengthening                 Ward Chatters, PT 05/26/2022, 12:35 PM

## 2022-05-27 ENCOUNTER — Ambulatory Visit: Payer: Medicaid Other

## 2022-05-29 ENCOUNTER — Encounter: Payer: Self-pay | Admitting: Physical Therapy

## 2022-05-29 ENCOUNTER — Ambulatory Visit: Payer: Medicaid Other | Admitting: Physical Therapy

## 2022-05-29 DIAGNOSIS — M6281 Muscle weakness (generalized): Secondary | ICD-10-CM

## 2022-05-29 DIAGNOSIS — M5459 Other low back pain: Secondary | ICD-10-CM | POA: Diagnosis not present

## 2022-05-29 NOTE — Therapy (Signed)
OUTPATIENT PHYSICAL THERAPY TREATMENT NOTE   Patient Name: Chelsea Cherry MRN: [id number] DOB:26-Jun-1970, 52 y.o., female Today's Date: 05/29/2022  PCP: Simona Huh, NP  REFERRING PROVIDER: Simona Huh, NP   END OF SESSION:   PT End of Session - 05/29/22 1541     Visit Number 8    Number of Visits 17    Date for PT Re-Evaluation 06/25/22    Authorization Type [employer]    Authorization Time Period 9 visits approved 05/01/22-06/29/22    Authorization - Visit Number 7    Authorization - Number of Visits 9    PT Start Time [id number]   pt arrived late   PT Stop Time [id number]    PT Time Calculation (min) 38 min    Activity Tolerance Patient tolerated treatment well    Behavior During Therapy WFL for tasks assessed/performed                Past Medical History:  Diagnosis Date   Anxiety    Chronic headaches    Colon polyps    Depression    Fibroid    Fibromyalgia    Hyperlipidemia    IBS (irritable bowel syndrome)    Mitral valve prolapse    SVT (supraventricular tachycardia)    UTI (urinary tract infection)    Vaginal Pap smear, abnormal    Vitamin D deficiency    Past Surgical History:  Procedure Laterality Date   abdominal laproscopic surgery     BREAST BIOPSY Right    BREAST EXCISIONAL BIOPSY Right    x3   BREAST EXCISIONAL BIOPSY Left    CERVICAL BIOPSY  W/ LOOP ELECTRODE EXCISION     FOOT SURGERY     SVT ABLATION N/A 05/01/2020   Procedure: SVT ABLATION;  Surgeon: Thompson Grayer, MD;  Location: [facility];  Service: Cardiovascular;  Laterality: N/A;   thumb surgery     TUBAL LIGATION     WISDOM TOOTH EXTRACTION     Patient Active Problem List   Diagnosis Date Noted   SVT (supraventricular tachycardia) 02/27/2020   Vaginitis, atrophic 02/27/2020   Dyspareunia in female 02/27/2020   Chronic pelvic pain in female 02/27/2020   Well woman exam with routine gynecological exam 02/27/2020   Fibromyalgia 02/27/2020   Bipolar II disorder (HCC)     MDD (major depressive disorder), recurrent severe, without psychosis ([place]) 04/04/2019   MDD (major depressive disorder), recurrent episode, moderate ([place]) 04/17/2017    REFERRING DIAG: M54.50 (ICD-10-CM) - Low back pain, unspecified    THERAPY DIAG:  Muscle weakness (generalized)  Rationale for Evaluation and Treatment Rehabilitation  PERTINENT HISTORY: SVT, Depression, Fibromyalgia    PRECAUTIONS: None  SUBJECTIVE:  SUBJECTIVE STATEMENT:   Pt reports that she is in a fibro flare than that her L thigh is particularly painful.  She feels she will be very limited in the activities she will be able to complete.   PAIN:  Are you having pain?  Yes: NPRS scale: 8/10 Worst: 10/10 Pain location: lower back, LE (L>R),  Pain description: sharp, N/T Aggravating factors: prolonged standing, prolonged sitting, walking Relieving factors: heat, medication    OBJECTIVE: (objective measures completed at initial evaluation unless otherwise dated)   DIAGNOSTIC FINDINGS:  See imaging    PATIENT SURVEYS:  ODI: 35/50 - 70% disability    COGNITION: Overall cognitive status: Within functional limits for tasks assessed                          SENSATION: Light touch: Impaired - decreased light touch L LE   POSTURE: rounded shoulders, forward head, and increased lumbar lordosis   MUSCLE LENGTH:            Thomas Test: (+)    PALPATION: TTP to bilateral lumbar paraspinals, L proximal hip    LUMBAR ROM:    AROM eval  Flexion Reduced with pain  Extension Reduced with pain  Right lateral flexion    Left lateral flexion    Right rotation Reduced with pain  Left rotation Reduced with pain   (Blank rows = not tested)   LOWER EXTREMITY MMT:     MMT Right eval Left eval  Hip flexion 4/5 3+/5  Hip  extension      Hip abduction 3+/5 3+/5  Hip adduction      Hip internal rotation      Hip external rotation      Knee flexion      Knee extension       (Blank rows = not tested)   LOWER EXTREMITY MYOTOMES:   Myotome Right Left  L2 4/5 3/5  L3 4/5 3/5  L4 4/5 3/5  L5 4/5 4/5  S1 4/5 4/5      LUMBAR SPECIAL TESTS:  Straight leg raise test: Positive, Slump test: Positive, Stork standing: Positive, SI Compression/distraction test: Positive, and Long sit test: Positive Left   FUNCTIONAL TESTS:  30 Second STS: 5 reps   GAIT: Distance walked: 22f Assistive device utilized: None Level of assistance: Complete [place] Comments: antalgic gait L LE, decreased gait speed   TREATMENT:   TREATMENT 05/29/22:   [address]atic therapy [address] - therapeutic pool temp 92 degrees Pt enters building independently.  Treatment took place in water 3.8 to  4 ft 8 in.feet deep depending upon activity.  Pt entered and exited the pool via stair and handrails     Aquatic Therapy:   Water walking for warm up fwd/lat/bkwds   Standing: Cross-over walking - 2 laps Walking march - with blue noodle for support - 1 lap Ai-chi postures 1-8 with concentration on hip position and mobility    Pt requires the buoyancy of water for active assisted exercises with buoyancy supported for strengthening and AROM exercises. Hydrostatic pressure also supports joints by unweighting joint load by at least 50 % in 3-4 feet depth water. 80% in chest to neck deep water. Water will provide assistance with movement using the current and laminar flow while the buoyancy reduces weight bearing. Pt requires the viscosity of the water for resistance with strengthening exercises.  [facility]  [facility]: 05/[facility]/22 Therapeutic Exercise: Nustep level 6 x 5 mins while gathering subjective and planning session with patient Palloff press 7# x10  BIL Sidestepping at counter RTB at ankles x3 laps  Supine figure 4 piriformis stretch push/pull Lt x45" each Supine sciatic nerve glide x10 Lt LTR x10 BIL Sidelying open books x10 BIL Supine 90/90 isometric hold 2x30" Supine 90/90 with alternating heel tap x5 BIL (pain in R hip flexor, tingling down RLE) Bridge with ball 2x10 SI joint correction hooklying (painful)   TREATMENT 05/15/22:  Aquatic therapy at [address] - therapeutic pool temp 92 degrees Pt enters building independently.  Treatment took place in water 3.8 to  4 ft 8 in.feet deep depending upon activity.  Pt entered and exited the pool via stair and handrails    Aquatic Therapy:  Water walking for warm up fwd/lat/bkwds  Standing: Cross-over walking - 2 laps Walking march - with blue noodle for support - 1 lap Hip flexor stretch on step (d/c d/t LBP even with pelvic tilt and good form) Hip abd/add x20 BIL Hip Circles CC/CCW 2x10 each BIL Heel raise x20 Single leg heel raises - x20  Core/shoulders: Rainbow dumbell push down - 2x10 Water bell push/pull in sitting Bil and alternating   Sitting on bench in water: Bicycle kicks x1' Reverse bicycle kicks x1' Flutter kicks x1' Scissor kicks x1'  Stretching: Hamstring stretch on bottom step x30" BIL (not today)  Pt requires the buoyancy of water for active assisted exercises with buoyancy supported for strengthening and AROM exercises. Hydrostatic pressure also supports joints by unweighting joint load by at least 50 % in 3-4 feet depth water. 80% in chest to neck deep water. Water will provide assistance with movement using the current and laminar flow while the buoyancy reduces weight bearing. Pt requires the viscosity of the water for resistance with strengthening exercises.   [facility] Adult PT Treatment:                                                DATE: 05/13/22 Therapeutic Exercise: Nustep level 6 x 5 mins while gathering subjective and  planning session with patient Palloff press 3# x10 BIL Supine PPT 2x10 5" hold Supine PPT with clam 2x10 BTB Supine PPT with marching against BTB x10 Supine 90/90 isometric hold 2x30" Bridge 2x10 STS 2 x 10 - low table 10# KB     PATIENT EDUCATION:  Education details: eval findings, ODI, HEP, POC Person educated: Patient Education method: Explanation, Demonstration, and Handouts Education comprehension: verbalized understanding and returned demonstration   HOME EXERCISE PROGRAM: Access Code: [email or web address] URL: [email or web address] Date: 04/30/2022 Prepared by: Octavio Manns   Exercises - Seated Sciatic Tensioner  - 1 x daily - 7 x weekly - 2 sets - 10 reps - Supine Posterior Pelvic Tilt  - 1 x daily - 7 x weekly - 2 sets - 10 reps - 3-5 sec hold - Modified Thomas Stretch  - 1 x daily - 7 x weekly - 2-3 sets - 60 sec hold   ASSESSMENT:   CLINICAL IMPRESSION: Session today focused on hip mobility, gentle activity, and pain relief in the aquatic environment for use of buoyancy to offload joints and the viscosity of water as resistance during therapeutic exercise.  Vivie presents to clinic in fibro flare today.  D/t her high pain level we  concentrated on gentle movement with hip and lumbar mobility with ai-chi postures.  This was tolerated well.  She was encouraged to use the hot tub following session with gentle stretching.  Patient was able to tolerate all prescribed exercises in the aquatic environment with no adverse effects and reports feeling significantly looser at end of session at the end of the session. Patient continues to benefit from skilled PT services on land and aquatic based and should be progressed as able to improve functional independence.      OBJECTIVE IMPAIRMENTS: decreased activity tolerance, decreased balance, decreased mobility, difficulty walking, decreased ROM, decreased strength, postural dysfunction, and pain.    ACTIVITY LIMITATIONS: carrying,  lifting, bending, standing, squatting, stairs, and transfers   PARTICIPATION LIMITATIONS: driving, shopping, community activity, occupation, and yard work   PERSONAL FACTORS: Time since onset of injury/illness/exacerbation and 1-2 comorbidities: SVT, Depression, Fibromyalgia   are also affecting patient's functional outcome.     GOALS: Goals reviewed with patient? No   SHORT TERM GOALS: Target date: 05/21/2022   Pt will be compliant and knowledgeable with initial HEP for improved comfort and carryover Baseline: initial HEP given  Goal status: Ongoing Patient reports sporadic compliance 05/13/22   2.  Pt will self report back pain no greater than 7-8/10 for improved comfort and functional ability Baseline: 10/10 at worst Goal status: MET   LONG TERM GOALS: Target date: 06/25/2022     Pt will be decrease ODI disability score to no greater than 55% as proxy for functional improvement Baseline:  35/50 - 70% disability Goal status: INITIAL   2.  Pt will self report back pain no greater than 2-3/10 for improved comfort and functional ability Baseline: 10/10 at worst Goal status: INITIAL    3.  Pt will increase 30 Second Sit to Stand rep count to no less than 7 reps (MCID 2)for improved balance, strength, and functional mobility Baseline: 5 reps  Goal status: INITIAL    4.  Pt will improve bilateral LE MMT to no less than 4/5 for all tested motions for improved comfort and functional mobility with home ADLs and transfers Baseline: see chart Goal status: INITIAL   PLAN:   PT FREQUENCY: 2x/week   PT DURATION: 8 weeks   PLANNED INTERVENTIONS: Therapeutic exercises, Therapeutic activity, Neuromuscular re-education, Balance training, Gait training, Patient/Family education, Self Care, Joint mobilization, Aquatic Therapy, Dry Needling, Electrical stimulation, Cryotherapy, Moist heat, Manual therapy, and Re-evaluation.   PLAN FOR NEXT SESSION: assess HEP response, core and hip  strengthening                 Mathis Dad, PT 05/29/2022, 4:16 PM

## 2022-06-04 ENCOUNTER — Ambulatory Visit: Payer: Medicaid Other

## 2022-06-04 DIAGNOSIS — R2689 Other abnormalities of gait and mobility: Secondary | ICD-10-CM

## 2022-06-04 DIAGNOSIS — M5459 Other low back pain: Secondary | ICD-10-CM

## 2022-06-04 DIAGNOSIS — M6281 Muscle weakness (generalized): Secondary | ICD-10-CM

## 2022-06-04 NOTE — Therapy (Signed)
OUTPATIENT PHYSICAL THERAPY TREATMENT NOTE   Patient Name: Chelsea Cherry MRN: [id number] DOB:Jun 29, 1970, 52 y.o., female Today's Date: 06/05/2022  PCP: Courtney Paris, NP  REFERRING PROVIDER: Courtney Paris, NP   END OF SESSION:   PT End of Session - 06/04/22 1619     Visit Number 9    Number of Visits 17    Date for PT Re-Evaluation 06/25/22    Authorization Type [employer]    Authorization Time Period 9 visits approved 05/01/22-06/29/22    Authorization - Visit Number 8    Authorization - Number of Visits 9    PT Start Time 1619   arrived late   PT Stop Time 1644    PT Time Calculation (min) 25 min    Activity Tolerance Patient tolerated treatment well    Behavior During Therapy WFL for tasks assessed/performed                 Past Medical History:  Diagnosis Date   Anxiety    Chronic headaches    Colon polyps    Depression    Fibroid    Fibromyalgia    Hyperlipidemia    IBS (irritable bowel syndrome)    Mitral valve prolapse    SVT (supraventricular tachycardia)    UTI (urinary tract infection)    Vaginal Pap smear, abnormal    Vitamin D deficiency    Past Surgical History:  Procedure Laterality Date   abdominal laproscopic surgery     BREAST BIOPSY Right    BREAST EXCISIONAL BIOPSY Right    x3   BREAST EXCISIONAL BIOPSY Left    CERVICAL BIOPSY  W/ LOOP ELECTRODE EXCISION     FOOT SURGERY     SVT ABLATION N/A 05/01/2020   Procedure: SVT ABLATION;  Surgeon: Hillis Range, MD;  Location: [facility];  Service: Cardiovascular;  Laterality: N/A;   thumb surgery     TUBAL LIGATION     WISDOM TOOTH EXTRACTION     Patient Active Problem List   Diagnosis Date Noted   SVT (supraventricular tachycardia) 02/27/2020   Vaginitis, atrophic 02/27/2020   Dyspareunia in female 02/27/2020   Chronic pelvic pain in female 02/27/2020   Well woman exam with routine gynecological exam 02/27/2020   Fibromyalgia 02/27/2020   Bipolar II disorder (HCC)     MDD (major depressive disorder), recurrent severe, without psychosis (HCC) 04/04/2019   MDD (major depressive disorder), recurrent episode, moderate (HCC) 04/17/2017    REFERRING DIAG: M54.50 (ICD-10-CM) - Low back pain, unspecified    THERAPY DIAG:  Muscle weakness (generalized)  Other low back pain  Other abnormalities of gait and mobility  Rationale for Evaluation and Treatment Rehabilitation  PERTINENT HISTORY: SVT, Depression, Fibromyalgia    PRECAUTIONS: None  SUBJECTIVE:  SUBJECTIVE STATEMENT:  Pt presents to PT with reports of severe lower back and L LE pain as well as recent onset of continued N/T.    PAIN:  Are you having pain?  Yes: NPRS scale: 10/10 Worst: 10/10 Pain location: lower back, LE (L>R),  Pain description: sharp, N/T Aggravating factors: prolonged standing, prolonged sitting, walking Relieving factors: heat, medication    OBJECTIVE: (objective measures completed at initial evaluation unless otherwise dated)   DIAGNOSTIC FINDINGS:  See imaging    PATIENT SURVEYS:  ODI: 35/50 - 70% disability    COGNITION: Overall cognitive status: Within functional limits for tasks assessed                          SENSATION: Light touch: Impaired - decreased light touch L LE   POSTURE: rounded shoulders, forward head, and increased lumbar lordosis   MUSCLE LENGTH:            Thomas Test: (+)    PALPATION: TTP to bilateral lumbar paraspinals, L proximal hip    LUMBAR ROM:    AROM eval  Flexion Reduced with pain  Extension Reduced with pain  Right lateral flexion    Left lateral flexion    Right rotation Reduced with pain  Left rotation Reduced with pain   (Blank rows = not tested)   LOWER EXTREMITY MMT:     MMT Right eval Left eval  Hip flexion 4/5 3+/5  Hip  extension      Hip abduction 3+/5 3+/5  Hip adduction      Hip internal rotation      Hip external rotation      Knee flexion      Knee extension       (Blank rows = not tested)   LOWER EXTREMITY MYOTOMES:   Myotome Right Left  L2 4/5 3/5  L3 4/5 3/5  L4 4/5 3/5  L5 4/5 4/5  S1 4/5 4/5      LUMBAR SPECIAL TESTS:  Straight leg raise test: Positive, Slump test: Positive, Stork standing: Positive, SI Compression/distraction test: Positive, and Long sit test: Positive Left   FUNCTIONAL TESTS:  30 Second STS: 5 reps   GAIT: Distance walked: 8ft Assistive device utilized: None Level of assistance: Complete Independence Comments: antalgic gait L LE, decreased gait speed   TREATMENT: [facility] Adult PT Treatment:                                                DATE: 06/04/22 Therapeutic Exercise: Seated sciatic nerve glide x 10 L Review of other HEP  Self Care: Talked about POC and pain management strategies secondary to fibromyalgia flair and pt hesitancy to perform many therapeutic interventions today  TREATMENT 05/29/22:   Aquatic therapy at [facility] [address] - therapeutic pool temp 92 degrees Pt enters building independently.  Treatment took place in water 3.8 to  4 ft 8 in.feet deep depending upon activity.  Pt entered and exited the pool via stair and handrails     Aquatic Therapy:   Water walking for warm up fwd/lat/bkwds   Standing: Cross-over walking - 2 laps Walking march - with blue noodle for support - 1 lap Ai-chi postures 1-8 with concentration on hip position and mobility    Pt requires the buoyancy of water for active assisted  exercises with buoyancy supported for strengthening and AROM exercises. Hydrostatic pressure also supports joints by unweighting joint load by at least 50 % in 3-4 feet depth water. 80% in chest to neck deep water. Water will provide assistance with movement using the current and laminar flow while the buoyancy reduces weight  bearing. Pt requires the viscosity of the water for resistance with strengthening exercises.  [facility] Adult PT Treatment:                                                DATE: 05/20/22 Therapeutic Exercise: Nustep level 6 x 5 mins while gathering subjective and planning session with patient Palloff press 7# x10 BIL Sidestepping at counter RTB at ankles x3 laps  Supine figure 4 piriformis stretch push/pull Lt x45" each Supine sciatic nerve glide x10 Lt LTR x10 BIL Sidelying open books x10 BIL Supine 90/90 isometric hold 2x30" Supine 90/90 with alternating heel tap x5 BIL (pain in R hip flexor, tingling down RLE) Bridge with ball 2x10 SI joint correction hooklying (painful)   TREATMENT 05/15/22:  Aquatic therapy at [facility]- [address] - therapeutic pool temp 92 degrees Pt enters building independently.  Treatment took place in water 3.8 to  4 ft 8 in.feet deep depending upon activity.  Pt entered and exited the pool via stair and handrails    Aquatic Therapy:  Water walking for warm up fwd/lat/bkwds  Standing: Cross-over walking - 2 laps Walking march - with blue noodle for support - 1 lap Hip flexor stretch on step (d/c d/t LBP even with pelvic tilt and good form) Hip abd/add x20 BIL Hip Circles CC/CCW 2x10 each BIL Heel raise x20 Single leg heel raises - x20  Core/shoulders: Rainbow dumbell push down - 2x10 Water bell push/pull in sitting Bil and alternating   Sitting on bench in water: Bicycle kicks x1' Reverse bicycle kicks x1' Flutter kicks x1' Scissor kicks x1'  Stretching: Hamstring stretch on bottom step x30" BIL (not today)  Pt requires the buoyancy of water for active assisted exercises with buoyancy supported for strengthening and AROM exercises. Hydrostatic pressure also supports joints by unweighting joint load by at least 50 % in 3-4 feet depth water. 80% in chest to neck deep water. Water will provide assistance with movement using the current and  laminar flow while the buoyancy reduces weight bearing. Pt requires the viscosity of the water for resistance with strengthening exercises.   [facility] Adult PT Treatment:                                                DATE: 05/13/22 Therapeutic Exercise: Nustep level 6 x 5 mins while gathering subjective and planning session with patient Palloff press 3# x10 BIL Supine PPT 2x10 5" hold Supine PPT with clam 2x10 BTB Supine PPT with marching against BTB x10 Supine 90/90 isometric hold 2x30" Bridge 2x10 STS 2 x 10 - low table 10# KB     PATIENT EDUCATION:  Education details: eval findings, ODI, HEP, POC Person educated: Patient Education method: Explanation, Demonstration, and Handouts Education comprehension: verbalized understanding and returned demonstration   HOME EXERCISE PROGRAM: Access Code: [email or web address] URL: [email or web address] Date: 06/04/2022 Prepared by: Edwinna Areola  Exercises - Seated  Sciatic Tensioner  - 1 x daily - 7 x weekly - 2 sets - 10 reps - Supine Posterior Pelvic Tilt  - 1 x daily - 7 x weekly - 2 sets - 10 reps - 3-5 sec hold - Modified Thomas Stretch  - 1 x daily - 7 x weekly - 2-3 sets - 60 sec hold - Supine Lower Trunk Rotation  - 1 x daily - 7 x weekly - 2 sets - 10 reps   ASSESSMENT:   CLINICAL IMPRESSION: Pt tolerated treatment fair with discussion on chronic pain management strategies and review of HEP. Per pt request, session ended early with further therapeutic intervention held secondary to severe pain. Pt wants to have last aquatic session then wait until seeing PCP for further evaluation before possible continuation or discharge from skilled PT services. PT in agreement with current plan.    OBJECTIVE IMPAIRMENTS: decreased activity tolerance, decreased balance, decreased mobility, difficulty walking, decreased ROM, decreased strength, postural dysfunction, and pain.    ACTIVITY LIMITATIONS: carrying, lifting, bending, standing,  squatting, stairs, and transfers   PARTICIPATION LIMITATIONS: driving, shopping, community activity, occupation, and yard work   PERSONAL FACTORS: Time since onset of injury/illness/exacerbation and 1-2 comorbidities: SVT, Depression, Fibromyalgia   are also affecting patient's functional outcome.     GOALS: Goals reviewed with patient? No   SHORT TERM GOALS: Target date: 05/21/2022   Pt will be compliant and knowledgeable with initial HEP for improved comfort and carryover Baseline: initial HEP given  Goal status: Ongoing Patient reports sporadic compliance 05/13/22   2.  Pt will self report back pain no greater than 7-8/10 for improved comfort and functional ability Baseline: 10/10 at worst Goal status: MET   LONG TERM GOALS: Target date: 06/25/2022     Pt will be decrease ODI disability score to no greater than 55% as proxy for functional improvement Baseline:  35/50 - 70% disability Goal status: INITIAL   2.  Pt will self report back pain no greater than 2-3/10 for improved comfort and functional ability Baseline: 10/10 at worst Goal status: INITIAL    3.  Pt will increase 30 Second Sit to Stand rep count to no less than 7 reps (MCID 2)for improved balance, strength, and functional mobility Baseline: 5 reps  Goal status: INITIAL    4.  Pt will improve bilateral LE MMT to no less than 4/5 for all tested motions for improved comfort and functional mobility with home ADLs and transfers Baseline: see chart Goal status: INITIAL   PLAN:   PT FREQUENCY: 2x/week   PT DURATION: 8 weeks   PLANNED INTERVENTIONS: Therapeutic exercises, Therapeutic activity, Neuromuscular re-education, Balance training, Gait training, Patient/Family education, Self Care, Joint mobilization, Aquatic Therapy, Dry Needling, Electrical stimulation, Cryotherapy, Moist heat, Manual therapy, and Re-evaluation.   PLAN FOR NEXT SESSION: assess HEP response, core and hip strengthening                  Eloy End, PT 06/05/2022, 8:41 AM

## 2022-06-05 ENCOUNTER — Ambulatory Visit: Payer: Medicaid Other | Admitting: Physical Therapy

## 2022-06-05 DIAGNOSIS — M5459 Other low back pain: Secondary | ICD-10-CM

## 2022-06-05 DIAGNOSIS — R2689 Other abnormalities of gait and mobility: Secondary | ICD-10-CM

## 2022-06-05 DIAGNOSIS — M6281 Muscle weakness (generalized): Secondary | ICD-10-CM

## 2022-06-05 NOTE — Therapy (Addendum)
OUTPATIENT PHYSICAL THERAPY TREATMENT NOTE/DISCHARGE  PHYSICAL THERAPY DISCHARGE SUMMARY  Visits from Start of Care: 10  Current functional level related to goals / functional outcomes: See goals/objective   Remaining deficits: Unable to assess   Education / Equipment: HEP   Patient agrees to discharge. Patient goals were unable to assess. Patient is being discharged due to not returning since the last visit.     Patient Name: Chelsea Cherry MRN: [id number] DOB:November 26, 1970, 52 y.o., female Today's Date: 06/05/2022  PCP: Courtney Paris, NP  REFERRING PROVIDER: Courtney Paris, NP   END OF SESSION:   PT End of Session - 06/05/22 1411     Visit Number 10    Number of Visits 17    Date for PT Re-Evaluation 06/25/22    Authorization Type [employer]    Authorization Time Period 9 visits approved 05/01/22-06/29/22    Authorization - Visit Number 9    Authorization - Number of Visits 9    PT Start Time 1410    PT Stop Time 1450    PT Time Calculation (min) 40 min    Activity Tolerance Patient tolerated treatment well    Behavior During Therapy WFL for tasks assessed/performed                Past Medical History:  Diagnosis Date   Anxiety    Chronic headaches    Colon polyps    Depression    Fibroid    Fibromyalgia    Hyperlipidemia    IBS (irritable bowel syndrome)    Mitral valve prolapse    SVT (supraventricular tachycardia)    UTI (urinary tract infection)    Vaginal Pap smear, abnormal    Vitamin D deficiency    Past Surgical History:  Procedure Laterality Date   abdominal laproscopic surgery     BREAST BIOPSY Right    BREAST EXCISIONAL BIOPSY Right    x3   BREAST EXCISIONAL BIOPSY Left    CERVICAL BIOPSY  W/ LOOP ELECTRODE EXCISION     FOOT SURGERY     SVT ABLATION N/A 05/01/2020   Procedure: SVT ABLATION;  Surgeon: Hillis Range, MD;  Location: [facility];  Service: Cardiovascular;  Laterality: N/A;   thumb surgery     TUBAL  LIGATION     WISDOM TOOTH EXTRACTION     Patient Active Problem List   Diagnosis Date Noted   SVT (supraventricular tachycardia) 02/27/2020   Vaginitis, atrophic 02/27/2020   Dyspareunia in female 02/27/2020   Chronic pelvic pain in female 02/27/2020   Well woman exam with routine gynecological exam 02/27/2020   Fibromyalgia 02/27/2020   Bipolar II disorder (HCC)    MDD (major depressive disorder), recurrent severe, without psychosis (HCC) 04/04/2019   MDD (major depressive disorder), recurrent episode, moderate (HCC) 04/17/2017    REFERRING DIAG: M54.50 (ICD-10-CM) - Low back pain, unspecified    THERAPY DIAG:  Muscle weakness (generalized)  Other low back pain  Other abnormalities of gait and mobility  Rationale for Evaluation and Treatment Rehabilitation  PERTINENT HISTORY: SVT, Depression, Fibromyalgia    PRECAUTIONS: None  SUBJECTIVE:  SUBJECTIVE STATEMENT:   Pt reports that se is feeling a bit better today now that the weather is a bit less rainy   PAIN:  Are you having pain?  Yes: NPRS scale: 6/10 Worst: 10/10 Pain location: lower back, LE (L>R),  Pain description: sharp, N/T Aggravating factors: prolonged standing, prolonged sitting, walking Relieving factors: heat, medication    OBJECTIVE: (objective measures completed at initial evaluation unless otherwise dated)   DIAGNOSTIC FINDINGS:  See imaging    PATIENT SURVEYS:  ODI: 35/50 - 70% disability    COGNITION: Overall cognitive status: Within functional limits for tasks assessed                          SENSATION: Light touch: Impaired - decreased light touch L LE   POSTURE: rounded shoulders, forward head, and increased lumbar lordosis   MUSCLE LENGTH:            Thomas Test: (+)    PALPATION: TTP to bilateral  lumbar paraspinals, L proximal hip    LUMBAR ROM:    AROM eval  Flexion Reduced with pain  Extension Reduced with pain  Right lateral flexion    Left lateral flexion    Right rotation Reduced with pain  Left rotation Reduced with pain   (Blank rows = not tested)   LOWER EXTREMITY MMT:     MMT Right eval Left eval  Hip flexion 4/5 3+/5  Hip extension      Hip abduction 3+/5 3+/5  Hip adduction      Hip internal rotation      Hip external rotation      Knee flexion      Knee extension       (Blank rows = not tested)   LOWER EXTREMITY MYOTOMES:   Myotome Right Left  L2 4/5 3/5  L3 4/5 3/5  L4 4/5 3/5  L5 4/5 4/5  S1 4/5 4/5      LUMBAR SPECIAL TESTS:  Straight leg raise test: Positive, Slump test: Positive, Stork standing: Positive, SI Compression/distraction test: Positive, and Long sit test: Positive Left   FUNCTIONAL TESTS:  30 Second STS: 5 reps   GAIT: Distance walked: 37ft Assistive device utilized: None Level of assistance: Complete Independence Comments: antalgic gait L LE, decreased gait speed   TREATMENT:   TREATMENT 06/05/22:   Aquatic therapy at [facility]- [address] - therapeutic pool temp 92 degrees Pt enters building independently.  Treatment took place in water 3.8 to  4 ft 8 in.feet deep depending upon activity.  Pt entered and exited the pool via stair and handrails     Aquatic Therapy:   Water walking for warm up fwd/lat/bkwds   Standing: Walking march - with blue noodle for support - 1 lap Ai-chi postures 1-12 with concentration on hip position and mobility    Pt requires the buoyancy of water for active assisted exercises with buoyancy supported for strengthening and AROM exercises. Hydrostatic pressure also supports joints by unweighting joint load by at least 50 % in 3-4 feet depth water. 80% in chest to neck deep water. Water will provide assistance with movement using the current and laminar flow while the buoyancy  reduces weight bearing. Pt requires the viscosity of the water for resistance with strengthening exercises.  [facility] Adult PT Treatment:  DATE: 05/20/22 Therapeutic Exercise: Nustep level 6 x 5 mins while gathering subjective and planning session with patient Palloff press 7# x10 BIL Sidestepping at counter RTB at ankles x3 laps  Supine figure 4 piriformis stretch push/pull Lt x45" each Supine sciatic nerve glide x10 Lt LTR x10 BIL Sidelying open books x10 BIL Supine 90/90 isometric hold 2x30" Supine 90/90 with alternating heel tap x5 BIL (pain in R hip flexor, tingling down RLE) Bridge with ball 2x10 SI joint correction hooklying (painful)   TREATMENT 05/15/22:  Aquatic therapy at [facility]- [address] - therapeutic pool temp 92 degrees Pt enters building independently.  Treatment took place in water 3.8 to  4 ft 8 in.feet deep depending upon activity.  Pt entered and exited the pool via stair and handrails    Aquatic Therapy:  Water walking for warm up fwd/lat/bkwds  Standing: Cross-over walking - 2 laps Walking march - with blue noodle for support - 1 lap Hip flexor stretch on step (d/c d/t LBP even with pelvic tilt and good form) Hip abd/add x20 BIL Hip Circles CC/CCW 2x10 each BIL Heel raise x20 Single leg heel raises - x20  Core/shoulders: Rainbow dumbell push down - 2x10 Water bell push/pull in sitting Bil and alternating   Sitting on bench in water: Bicycle kicks x1' Reverse bicycle kicks x1' Flutter kicks x1' Scissor kicks x1'  Stretching: Hamstring stretch on bottom step x30" BIL (not today)  Pt requires the buoyancy of water for active assisted exercises with buoyancy supported for strengthening and AROM exercises. Hydrostatic pressure also supports joints by unweighting joint load by at least 50 % in 3-4 feet depth water. 80% in chest to neck deep water. Water will provide assistance with movement using the  current and laminar flow while the buoyancy reduces weight bearing. Pt requires the viscosity of the water for resistance with strengthening exercises.   [facility] Adult PT Treatment:                                                DATE: 05/13/22 Therapeutic Exercise: Nustep level 6 x 5 mins while gathering subjective and planning session with patient Palloff press 3# x10 BIL Supine PPT 2x10 5" hold Supine PPT with clam 2x10 BTB Supine PPT with marching against BTB x10 Supine 90/90 isometric hold 2x30" Bridge 2x10 STS 2 x 10 - low table 10# KB     PATIENT EDUCATION:  Education details: eval findings, ODI, HEP, POC Person educated: Patient Education method: Explanation, Demonstration, and Handouts Education comprehension: verbalized understanding and returned demonstration   HOME EXERCISE PROGRAM: Access Code: [email or web address] URL: [email or web address] Date: 04/30/2022 Prepared by: Edwinna Areola   Exercises - Seated Sciatic Tensioner  - 1 x daily - 7 x weekly - 2 sets - 10 reps - Supine Posterior Pelvic Tilt  - 1 x daily - 7 x weekly - 2 sets - 10 reps - 3-5 sec hold - Modified Thomas Stretch  - 1 x daily - 7 x weekly - 2-3 sets - 60 sec hold   ASSESSMENT:   CLINICAL IMPRESSION: Session today focused on hip mobility, gentle activity, and pain relief in the aquatic environment for use of buoyancy to offload joints and the viscosity of water as resistance during therapeutic exercise.  Chatoya presents with slightly less pain today.  We continued with gentle hip and trunk mobility  with Ai-chi postures.  We were able to progress through several more difficult postures today which included increased hip rotation and ext ROM.  This was tolerated well.  She was encouraged to use the hot tub following session with gentle stretching.  Patient was able to tolerate all prescribed exercises in the aquatic environment with no adverse effects and reports feeling significantly looser at end of  session at the end of the session. Patient continues to benefit from skilled PT services on land and aquatic based and should be progressed as able to improve functional independence.      OBJECTIVE IMPAIRMENTS: decreased activity tolerance, decreased balance, decreased mobility, difficulty walking, decreased ROM, decreased strength, postural dysfunction, and pain.    ACTIVITY LIMITATIONS: carrying, lifting, bending, standing, squatting, stairs, and transfers   PARTICIPATION LIMITATIONS: driving, shopping, community activity, occupation, and yard work   PERSONAL FACTORS: Time since onset of injury/illness/exacerbation and 1-2 comorbidities: SVT, Depression, Fibromyalgia   are also affecting patient's functional outcome.     GOALS: Goals reviewed with patient? No   SHORT TERM GOALS: Target date: 05/21/2022   Pt will be compliant and knowledgeable with initial HEP for improved comfort and carryover Baseline: initial HEP given  Goal status: Ongoing Patient reports sporadic compliance 05/13/22   2.  Pt will self report back pain no greater than 7-8/10 for improved comfort and functional ability Baseline: 10/10 at worst Goal status: MET   LONG TERM GOALS: Target date: 06/25/2022     Pt will be decrease ODI disability score to no greater than 55% as proxy for functional improvement Baseline:  35/50 - 70% disability Goal status: INITIAL   2.  Pt will self report back pain no greater than 2-3/10 for improved comfort and functional ability Baseline: 10/10 at worst Goal status: INITIAL    3.  Pt will increase 30 Second Sit to Stand rep count to no less than 7 reps (MCID 2)for improved balance, strength, and functional mobility Baseline: 5 reps  Goal status: INITIAL    4.  Pt will improve bilateral LE MMT to no less than 4/5 for all tested motions for improved comfort and functional mobility with home ADLs and transfers Baseline: see chart Goal status: INITIAL   PLAN:   PT FREQUENCY:  2x/week   PT DURATION: 8 weeks   PLANNED INTERVENTIONS: Therapeutic exercises, Therapeutic activity, Neuromuscular re-education, Balance training, Gait training, Patient/Family education, Self Care, Joint mobilization, Aquatic Therapy, Dry Needling, Electrical stimulation, Cryotherapy, Moist heat, Manual therapy, and Re-evaluation.   PLAN FOR NEXT SESSION: assess HEP response, core and hip strengthening                 Fredderick Phenix, PT 06/05/2022, 2:55 PM

## 2022-12-30 ENCOUNTER — Other Ambulatory Visit: Payer: Self-pay | Admitting: Nurse Practitioner

## 2022-12-30 DIAGNOSIS — Z1231 Encounter for screening mammogram for malignant neoplasm of breast: Secondary | ICD-10-CM

## 2023-02-03 ENCOUNTER — Ambulatory Visit
Admission: RE | Admit: 2023-02-03 | Discharge: 2023-02-03 | Disposition: A | Discharge status: Home / Self Care | Payer: Medicaid Other | Source: Ambulatory Visit | Attending: Nurse Practitioner | Admitting: Nurse Practitioner

## 2023-02-03 DIAGNOSIS — Z1231 Encounter for screening mammogram for malignant neoplasm of breast: Secondary | ICD-10-CM

## 2023-03-02 ENCOUNTER — Encounter: Payer: Self-pay | Admitting: Advanced Practice Midwife

## 2023-03-02 ENCOUNTER — Ambulatory Visit: Payer: Medicaid Other | Admitting: Advanced Practice Midwife

## 2023-03-02 VITALS — BP 115/71 | HR 72 | Ht 67.0 in | Wt 166.0 lb

## 2023-03-02 DIAGNOSIS — G8929 Other chronic pain: Secondary | ICD-10-CM | POA: Diagnosis not present

## 2023-03-02 DIAGNOSIS — I471 Supraventricular tachycardia, unspecified: Secondary | ICD-10-CM | POA: Diagnosis not present

## 2023-03-02 DIAGNOSIS — R102 Pelvic and perineal pain: Secondary | ICD-10-CM

## 2023-03-02 DIAGNOSIS — Z01419 Encounter for gynecological examination (general) (routine) without abnormal findings: Secondary | ICD-10-CM

## 2023-03-02 NOTE — Progress Notes (Signed)
Pt presents for AEX.  Last PAP 2021 Requesting PAP today

## 2023-03-02 NOTE — Progress Notes (Signed)
Subjective:     Chelsea Cherry is a 52 y.o. female here at [facility] for a routine exam.   Personal and family health history reviewed: yes. Do you have a primary care provider? Yes  Do you feel safe at home? Yes  Flowsheet Row Office Visit from 02/27/2020 in [facility] Total Score 0       Health Maintenance Due  Topic Date Due   HIV Screening  Never done   Hepatitis C Screening  Never done   DTaP/Tdap/Td (1 - Tdap) Never done   INFLUENZA VACCINE  Never done   COVID-19 Vaccine (1 - 2023-24 season) Never done   Zoster Vaccines- Shingrix (1 of 2) Never done     Risk factors for chronic health problems: Smoking: Former (Quit 12/2006) Alchohol/how much: 1 standard drink per week.  Pt BMI: Body mass index is 26 kg/m.   Gynecologic History No LMP recorded. Patient is postmenopausal. Last Pap: 02/27/2020 (NILM). Results were: normal. Hx of abnormal pap smears and HPV with previous LEEP (2014). Records not available in chart.  Last mammogram: 02/03/23. Results were: normal- would prefer to have one today.  Last colonoscopy: 07/2017. Results were: normal.   Obstetric History OB History  Gravida Para Term Preterm AB Living  3         3  SAB IAB Ectopic Multiple Live Births               # Outcome Date GA Lbr Len/2nd Weight Sex Type Anes PTL Lv  3 Gravida           2 Gravida           1 Gravida            The following portions of the patient's history were reviewed and updated as appropriate: allergies, current medications, past family history, past medical history, past social history, past surgical history, and problem list.  Review of Systems Pertinent items are noted in HPI.    Objective:  BP 115/71   Pulse 72   Ht 5\' 7"  (1.702 m)   Wt 166 lb (75.3 kg)   BMI 26.00 kg/m   VS reviewed, nursing note reviewed.  Constitutional: well developed, well nourished, no distress HEENT: normocephalic, thyroid without  enlargement or mass HEART: RRR, no murmurs rubs/gallops RESP: clear and equal to auscultation bilaterally in all lobes  Breast Exam: Hypertrophic scars on bilateral breasts. right breast without mass, skin or nipple changes or axillary nodes, left breast normal without mass, skin or nipple changes or axillary nodes Abdomen: soft Neuro: alert and oriented x 3 Skin: warm, dry Psych: affect normal Pelvic exam: Vaginal atrophy noted. No organ prolapse noted with valsalva. Cervix pink, without lesion, with stenosis of external os. No scant white creamy discharge, and external genitalia normal.  Bimanual exam: Cervix 0/long/high, firm, anterior, neg CMT, uterus nontender, nonenlarged, adnexa without tenderness, enlargement, or mass.       Assessment/Plan:   1. Well woman exam with routine gynecological exam Patient doing well without complaint. Will proceed with pap today given hx of abnormal results and 2014 LEEP (records not visible). Per ASCCP guidelines, repeat PAP every 3 years.  - Cytology - PAP( [place])  2. SVT (supraventricular tachycardia) (HCC) -Ablation in 04/2020, with no further tachycardic episodes. Will see cardiology as needed.   3. Chronic pelvic pain in female -Improved spontaneously. Patient did not attend PT (referred 2022).  Return in about 1 year (around 03/01/2024) for Annual Exam .   Ralene Muskrat, PA-C 4:44 PM    I have seen this patient and agree with the above [facility] provider's note.  LEFTWICH-KIRBY, Tatsuya Okray Certified Nurse-Midwife

## 2023-03-27 ENCOUNTER — Encounter: Payer: Self-pay | Admitting: Advanced Practice Midwife

## 2023-04-02 ENCOUNTER — Telehealth: Payer: Self-pay | Admitting: *Deleted

## 2023-04-02 NOTE — Telephone Encounter (Signed)
TC to patient to discuss recent annual visit and labs. Advised pt that Pap specimen was not received by [facility] and that it would need to be recollected. Apologies offered. Pt verbalized understanding and was very understanding. Call transferred to schedulers.

## 2023-04-07 ENCOUNTER — Ambulatory Visit: Payer: Medicaid Other | Admitting: Obstetrics and Gynecology

## 2023-04-07 ENCOUNTER — Other Ambulatory Visit (HOSPITAL_COMMUNITY)
Admission: RE | Admit: 2023-04-07 | Discharge: 2023-04-07 | Disposition: A | Discharge status: Home / Self Care | Payer: Medicaid Other | Source: Ambulatory Visit | Attending: Obstetrics and Gynecology | Admitting: Obstetrics and Gynecology

## 2023-04-07 ENCOUNTER — Encounter: Payer: Self-pay | Admitting: Obstetrics and Gynecology

## 2023-04-07 VITALS — BP 106/69 | HR 102 | Ht 67.0 in | Wt 164.0 lb

## 2023-04-07 DIAGNOSIS — Z01419 Encounter for gynecological examination (general) (routine) without abnormal findings: Secondary | ICD-10-CM | POA: Diagnosis present

## 2023-04-07 NOTE — Progress Notes (Signed)
Pt presents for repeat PAP. Previous specimen was misplaced.

## 2023-04-07 NOTE — Progress Notes (Signed)
   GYNECOLOGY PROGRESS NOTE  History:  52 y.o. G3P0 presents to [facility] for pap only. Previous pap was done and order placed, but cytology did not receivesample. Coming in today to repeat sample. Hx of leep in 2014. Most recent pap 2021 NILM.   The following portions of the patient's history were reviewed and updated as appropriate: allergies, current medications, past family history, past medical history, past social history, past surgical history and problem list.   Health Maintenance Due  Topic Date Due   HIV Screening  Never done   Hepatitis C Screening  Never done   DTaP/Tdap/Td (1 - Tdap) Never done   INFLUENZA VACCINE  Never done   COVID-19 Vaccine (1 - 2024-25 season) Never done   Zoster Vaccines- Shingrix (1 of 2) Never done     Review of Systems:  Pertinent items are noted in HPI.   Objective:  Physical Exam Blood pressure 106/69, pulse (!) 102, height 5' 7 (1.702 m), weight 164 lb (74.4 kg). VS reviewed, nursing note reviewed,  Constitutional: well developed, well nourished, no distress HEENT: normocephalic Pulm/chest wall: normal effort Abdomen: soft Neuro: alert and oriented  Skin: warm, dry Psych: affect normal Pelvic exam: Cervix pink, visually closed, without lesion, scant white creamy discharge, vaginal walls and external genitalia normal   Assessment & Plan:  1. Well woman exam with routine gynecological exam (Primary) Pap collected today, is getting records from [place]  for leep  - Cytology - PAP( )   Return in about 1 year (around 04/06/2024) for Chelsea Cherry Chelsea Delores, FNP

## 2023-04-13 LAB — CYTOLOGY - PAP
Adequacy: ABSENT
Comment: NEGATIVE
Diagnosis: NEGATIVE
High risk HPV: NEGATIVE

## 2023-04-30 ENCOUNTER — Ambulatory Visit
Admission: EM | Admit: 2023-04-30 | Discharge: 2023-04-30 | Disposition: A | Discharge status: Home / Self Care | Payer: Medicaid Other | Attending: Family Medicine | Admitting: Family Medicine

## 2023-04-30 DIAGNOSIS — B349 Viral infection, unspecified: Secondary | ICD-10-CM | POA: Diagnosis not present

## 2023-04-30 LAB — POC COVID19/FLU A&B COMBO
Covid Antigen, POC: NEGATIVE
Influenza A Antigen, POC: NEGATIVE
Influenza B Antigen, POC: NEGATIVE

## 2023-04-30 MED ORDER — IBUPROFEN 600 MG PO TABS
600.0000 mg | ORAL_TABLET | Freq: Four times a day (QID) | ORAL | 0 refills | Status: AC | PRN
Start: 2023-04-30 — End: ?

## 2023-04-30 MED ORDER — PROMETHAZINE-DM 6.25-15 MG/5ML PO SYRP: 5.0000 mL | ORAL_SOLUTION | Freq: Three times a day (TID) | ORAL | 0 refills | Status: AC | PRN

## 2023-04-30 MED ORDER — CETIRIZINE HCL 10 MG PO TABS
10.0000 mg | ORAL_TABLET | Freq: Every day | ORAL | 0 refills | Status: AC
Start: 2023-04-30 — End: ?

## 2023-04-30 NOTE — ED Provider Notes (Signed)
[facility]  Note:  This document was prepared using Conservation officer, historic buildings and may include unintentional dictation errors.  MRN: [id number] DOB: 1971/04/01  Subjective:   Chelsea Cherry is a 53 y.o. female presenting for 4-day history of acute onset shortness of breath, coughing, chest burning with her cough, hoarseness and drainage.  Her coworker tested positive for RSV.  Patient also travel to [place] just before she got sick.  No history of asthma.  No smoking of any kind.  No current facility-administered medications for this encounter.  Current Outpatient Medications:    Ascorbic Acid (VITAMIN C) 500 MG CAPS, , Disp: , Rfl:    Aspirin 81 MG CAPS, , Disp: , Rfl:    docusate sodium (COLACE) 100 MG capsule, , Disp: , Rfl:    HYDROcodone-Acetaminophen 7.5-300 MG TABS, Take by mouth., Disp: , Rfl:    traMADol (ULTRAM) 50 MG tablet, Take 50 mg by mouth 2 (two) times daily as needed., Disp: , Rfl:    busPIRone (BUSPAR) 10 MG tablet, Take 10 mg by mouth 3 (three) times daily., Disp: , Rfl:    dexlansoprazole (DEXILANT) 60 MG capsule, Take 60 mg by mouth daily., Disp: , Rfl:    DULoxetine (CYMBALTA) 60 MG capsule, Take 2 capsules (120 mg total) by mouth daily., Disp: 180 capsule, Rfl: 0   ergocalciferol (VITAMIN D2) 1.25 MG (50000 UT) capsule, Take 50,000 Units by mouth every Sunday., Disp: , Rfl:    fluticasone (FLONASE) 50 MCG/ACT nasal spray, Place 1 spray into both nostrils daily., Disp: , Rfl:    gabapentin (NEURONTIN) 300 MG capsule, Take 300-600 mg by mouth 2 (two) times daily. 300 mg in the morning, 300 mg at afternoon, and Take 600 mg by mouth at bedtime, Disp: , Rfl:    levothyroxine (SYNTHROID, LEVOTHROID) 50 MCG tablet, Take 0.025 mcg by mouth daily before breakfast., Disp: , Rfl:    LINZESS 72 MCG capsule, Take 72-144 mcg by mouth daily., Disp: , Rfl:    loratadine (CLARITIN) 10 MG tablet, Take 10 mg by mouth daily., Disp: , Rfl:     mirabegron ER (MYRBETRIQ) 50 MG TB24 tablet, Take 50 mg by mouth daily., Disp: , Rfl:    Multiple Vitamins-Minerals (MULTIVITAMIN WITH MINERALS) tablet, Take 1 tablet by mouth daily., Disp: , Rfl:    NARCAN 4 MG/0.1ML LIQD nasal spray kit, Place 1 spray into the nose once. , Disp: , Rfl:    pravastatin (PRAVACHOL) 40 MG tablet, SMARTSIG:1 Tablet(s) By Mouth Every Evening, Disp: , Rfl:    Semaglutide (OZEMPIC, 0.25 OR 0.5 MG/DOSE, [place]), Inject into the skin., Disp: , Rfl:    SUMAtriptan (IMITREX) 50 MG tablet, Take 50 mg by mouth every 2 (two) hours as needed for headache or migraine., Disp: , Rfl:    tiZANidine (ZANAFLEX) 4 MG capsule, Take 4 mg by mouth 3 (three) times daily., Disp: , Rfl:    topiramate (TOPAMAX) 100 MG tablet, Take 100 mg by mouth at bedtime., Disp: , Rfl:    No Known Allergies  Past Medical History:  Diagnosis Date   Anxiety    Chronic headaches    Colon polyps    Depression    Fibroid    Fibromyalgia    Hyperlipidemia    IBS (irritable bowel syndrome)    Mitral valve prolapse    SVT (supraventricular tachycardia) (HCC)    UTI (urinary tract infection)    Vaginal Pap smear, abnormal    Vitamin D  deficiency      Past Surgical History:  Procedure Laterality Date   abdominal laproscopic surgery     BREAST BIOPSY Right    BREAST EXCISIONAL BIOPSY Right    x3   BREAST EXCISIONAL BIOPSY Left    CERVICAL BIOPSY  W/ LOOP ELECTRODE EXCISION     FOOT SURGERY     SVT ABLATION N/A 05/01/2020   Procedure: SVT ABLATION;  Surgeon: Hillis Range, MD;  Location: [facility];  Service: Cardiovascular;  Laterality: N/A;   thumb surgery     TUBAL LIGATION     WISDOM TOOTH EXTRACTION      Family History  Problem Relation Age of Onset   Fibromyalgia Mother    Depression Mother    Anxiety disorder Mother    Heart disease Mother    Alcohol abuse Maternal Uncle    Alcohol abuse Maternal Grandmother    Ovarian cancer Maternal Grandmother 37   Bladder Cancer  Maternal Grandmother    Breast cancer Neg Hx    BRCA 1/2 Neg Hx     Social History   Tobacco Use   Smoking status: Former    Current packs/day: 0.00    Types: Cigarettes    Quit date: 01/03/2007    Years since quitting: 16.3   Smokeless tobacco: Never  Vaping Use   Vaping status: Never Used  Substance Use Topics   Alcohol use: Yes    Alcohol/week: 1.0 standard drink of alcohol    Types: 1 Standard drinks or equivalent per week    Comment: 1-3 per week   Drug use: Not Currently    Types: Marijuana    ROS   Objective:   Vitals: BP 97/64 (BP Location: Left Arm)   Pulse 75   Temp 97.9 F (36.6 C) (Oral)   Resp 18   Physical Exam Constitutional:      General: She is not in acute distress.    Appearance: Normal appearance. She is well-developed and normal weight. She is not ill-appearing, toxic-appearing or diaphoretic.  HENT:     Head: Normocephalic and atraumatic.     Right Ear: Tympanic membrane, ear canal and external ear normal. No drainage or tenderness. No middle ear effusion. There is no impacted cerumen. Tympanic membrane is not erythematous or bulging.     Left Ear: Tympanic membrane, ear canal and external ear normal. No drainage or tenderness.  No middle ear effusion. There is no impacted cerumen. Tympanic membrane is not erythematous or bulging.     Nose: Congestion present. No rhinorrhea.     Mouth/Throat:     Mouth: Mucous membranes are moist. No oral lesions.     Pharynx: No pharyngeal swelling, oropharyngeal exudate, posterior oropharyngeal erythema or uvula swelling.     Tonsils: No tonsillar exudate or tonsillar abscesses.     Comments: Hoarseness noted, postnasal drainage overlying pharynx. Eyes:     General: No scleral icterus.       Right eye: No discharge.        Left eye: No discharge.     Extraocular Movements: Extraocular movements intact.     Right eye: Normal extraocular motion.     Left eye: Normal extraocular motion.      Conjunctiva/sclera: Conjunctivae normal.  Cardiovascular:     Rate and Rhythm: Normal rate and regular rhythm.     Heart sounds: Normal heart sounds. No murmur heard.    No friction rub. No gallop.  Pulmonary:     Effort: Pulmonary  effort is normal. No respiratory distress.     Breath sounds: No stridor. No wheezing, rhonchi or rales.  Chest:     Chest wall: No tenderness.  Musculoskeletal:     Cervical back: Normal range of motion and neck supple.  Lymphadenopathy:     Cervical: No cervical adenopathy.  Skin:    General: Skin is warm and dry.  Neurological:     General: No focal deficit present.     Mental Status: She is alert and oriented to person, place, and time.  Psychiatric:        Mood and Affect: Mood normal.        Behavior: Behavior normal.     Results for orders placed or performed during the hospital encounter of 04/30/23 (from the past 24 hours)  POC Covid19/Flu A&B Antigen     Status: Normal   Collection Time: 04/30/23  3:53 PM  Result Value Ref Range   Influenza A Antigen, POC Negative    Influenza B Antigen, POC Negative    Covid Antigen, POC Negative     Assessment and Plan :   PDMP not reviewed this encounter.  1. Acute viral syndrome    Deferred imaging given clear cardiopulmonary exam, hemodynamically stable vital signs. Suspect viral URI, viral syndrome. Physical exam findings reassuring and vital signs stable for discharge. Advised supportive care, offered symptomatic relief. Counseled patient on potential for adverse effects with medications prescribed/recommended today, ER and return-to-clinic precautions discussed, patient verbalized understanding.     Wallis Bamberg, PA-C 04/30/23 1726

## 2023-04-30 NOTE — ED Triage Notes (Signed)
Pt reports laryngitis x 4 days; shortness of breath, cough, chest burning when coughing.Reports a coworker was diagnosed with RSV. Pt taking vitamin c.

## 2023-04-30 NOTE — Discharge Instructions (Addendum)
 We will manage this as a viral illness. For sore throat or cough try using a honey-based tea. Use 3 teaspoons of honey with juice squeezed from half lemon. Place shaved pieces of ginger into 1/2-1 cup of water and warm over stove top. Then mix the ingredients and repeat every 4 hours as needed. Please take ibuprofen 600mg  every 6 hours with food alternating with OR taken together with Tylenol 500mg -650mg  every 6 hours for throat pain, fevers, aches and pains. Hydrate very well with at least 2 liters of water. Eat light meals such as soups (chicken and noodles, vegetable, chicken and wild rice).  Do not eat foods that you are allergic to.  Taking an antihistamine like Zyrtec (10mg  daily) can help against postnasal drainage, sinus congestion which can cause sinus pain, sinus headaches, throat pain, painful swallowing, coughing.  You can take this together with cough syrup as needed.

## 2023-11-13 NOTE — Telephone Encounter (Signed)
 Noted. Scheduling directives updated to reflect.

## 2023-12-21 ENCOUNTER — Other Ambulatory Visit: Payer: Self-pay | Admitting: Nurse Practitioner

## 2023-12-21 DIAGNOSIS — Z0000 Encounter for general adult medical examination without abnormal findings: Secondary | ICD-10-CM

## 2024-02-04 ENCOUNTER — Ambulatory Visit
Admission: RE | Admit: 2024-02-04 | Discharge: 2024-02-04 | Disposition: A | Source: Ambulatory Visit | Attending: Nurse Practitioner | Admitting: Nurse Practitioner

## 2024-02-04 DIAGNOSIS — Z0000 Encounter for general adult medical examination without abnormal findings: Secondary | ICD-10-CM

## 2024-04-05 ENCOUNTER — Ambulatory Visit

## 2024-04-05 ENCOUNTER — Other Ambulatory Visit: Payer: Self-pay

## 2024-04-05 DIAGNOSIS — M5459 Other low back pain: Secondary | ICD-10-CM | POA: Insufficient documentation

## 2024-04-05 NOTE — Therapy (Signed)
 " OUTPATIENT PHYSICAL THERAPY THORACOLUMBAR EVALUATION   Patient Name: Chelsea Cherry MRN: [id number] DOB:[birth year], 53 y.o., female Today's Date: 04/06/2024  END OF SESSION:  PT End of Session - 04/05/24 1656     Visit Number 1    Authorization Type [employer]    Authorization Time Period Auth required    PT Start Time 1700    PT Stop Time 1738    PT Time Calculation (min) 38 min    Activity Tolerance Patient tolerated treatment well    Behavior During Therapy WFL for tasks assessed/performed          Past Medical History:  Diagnosis Date   Anxiety    Chronic headaches    Colon polyps    Depression    Fibroid    Fibromyalgia    Hyperlipidemia    IBS (irritable bowel syndrome)    Mitral valve prolapse    SVT (supraventricular tachycardia)    UTI (urinary tract infection)    Vaginal Pap smear, abnormal    Vitamin D deficiency    Past Surgical History:  Procedure Laterality Date   abdominal laproscopic surgery     BREAST BIOPSY Right    BREAST EXCISIONAL BIOPSY Right    x3   BREAST EXCISIONAL BIOPSY Left    CERVICAL BIOPSY  W/ LOOP ELECTRODE EXCISION     FOOT SURGERY     SVT ABLATION N/A 05/01/2020   Procedure: SVT ABLATION;  Surgeon: Kelsie Agent, MD;  Location: [facility];  Service: Cardiovascular;  Laterality: N/A;   thumb surgery     TUBAL LIGATION     WISDOM TOOTH EXTRACTION     Patient Active Problem List   Diagnosis Date Noted   SVT (supraventricular tachycardia) 02/27/2020   Vaginitis, atrophic 02/27/2020   Dyspareunia in female 02/27/2020   Chronic pelvic pain in female 02/27/2020   Well woman exam with routine gynecological exam 02/27/2020   Fibromyalgia 02/27/2020   Bipolar II disorder (HCC)    MDD (major depressive disorder), recurrent severe, without psychosis (HCC) 04/04/2019   MDD (major depressive disorder), recurrent episode, moderate (HCC) 04/17/2017    PCP: Leron Millman, NP  REFERRING PROVIDER: Leron Millman,  NP  REFERRING DIAG: M54.50 (ICD-10-CM) - Low back pain, unspecified M25.551 (ICD-10-CM) - Pain in right hip M50.30 (ICD-10-CM) - Other cervical disc degeneration, unspecified cervical region M79.7 (ICD-10-CM) - Fibromyalgia Z79.899 (ICD-10-CM) - Other long term (current) drug therapy  Rationale for Evaluation and Treatment: Rehabilitation  THERAPY DIAG:  Other low back pain  ONSET DATE:   SUBJECTIVE:  SUBJECTIVE STATEMENT:   PERTINENT HISTORY:    PAIN:  Are you having pain? Yes: NPRS scale:   Pain location:   Pain description:   Aggravating factors:   Relieving factors:    PRECAUTIONS:   RED FLAGS:    WEIGHT BEARING RESTRICTIONS:   FALLS:  Has patient fallen in last 6 months?   LIVING ENVIRONMENT: Lives with:  Lives in:  Stairs:  Has following equipment at home:  OCCUPATION:   PLOF:   PATIENT GOALS:   NEXT MD VISIT:   OBJECTIVE:  Note: Objective measures were completed at Evaluation unless otherwise noted.  DIAGNOSTIC FINDINGS:    PATIENT SURVEYS:    COGNITION: Overall cognitive status:      SENSATION:   MUSCLE LENGTH: Hamstrings: Right  deg; Left  deg Debby test: Right  deg; Left  deg  POSTURE:   PALPATION:   LUMBAR ROM:   AROM eval  Flexion   Extension   Right lateral flexion   Left lateral flexion   Right rotation   Left rotation    (Blank rows = not tested)  LOWER EXTREMITY ROM:       Right eval Left eval  Hip flexion    Hip extension    Hip abduction    Hip adduction    Hip internal rotation    Hip external rotation    Knee flexion    Knee extension    Ankle dorsiflexion    Ankle plantarflexion    Ankle inversion    Ankle eversion     (Blank rows = not tested)  LOWER EXTREMITY MMT:    MMT Right eval Left eval  Hip flexion     Hip extension    Hip abduction    Hip adduction    Hip internal rotation    Hip external rotation    Knee flexion    Knee extension    Ankle dorsiflexion    Ankle plantarflexion    Ankle inversion    Ankle eversion     (Blank rows = not tested)  LUMBAR SPECIAL TESTS:    FUNCTIONAL TESTS:    GAIT: Distance walked:  Assistive device utilized:  Level of assistance:  Comments:    ASSESSMENT:  Pt arrived seeking help with disability paperwork. She was informed that PT does not assist with this and she should contact her PCP. She was not actively seeking PT treatment.     Marijo DELENA Berber, PT 04/06/2024, 2:42 PM  "

## 2024-04-21 ENCOUNTER — Ambulatory Visit: Payer: Self-pay | Admitting: Obstetrics and Gynecology

## 2024-04-21 ENCOUNTER — Encounter: Payer: Self-pay | Admitting: Obstetrics and Gynecology

## 2024-04-21 VITALS — BP 93/62 | HR 71 | Ht 67.0 in | Wt 172.8 lb

## 2024-04-21 DIAGNOSIS — N898 Other specified noninflammatory disorders of vagina: Secondary | ICD-10-CM

## 2024-04-21 DIAGNOSIS — Z01419 Encounter for gynecological examination (general) (routine) without abnormal findings: Secondary | ICD-10-CM

## 2024-04-21 NOTE — Progress Notes (Signed)
 Annual; abnormal PAP in past. LEEP in 2014. Last PAP 03/2023.   Mammogram 02/2024. Pt reports recent colonoscopy as well.   Tubal ligation 2009  Pt scored 5 on PHQ; she states she already has a therapist.   Breast exam today. PAP UTD. Declines STI/STD testing.

## 2024-04-21 NOTE — Patient Instructions (Signed)
 SABRA

## 2024-04-21 NOTE — Progress Notes (Signed)
 "   GYNECOLOGY ANNUAL PREVENTATIVE CARE ENCOUNTER NOTE  Subjective:   Chelsea Cherry is a 54 y.o. G34P0 female here for a annual gynecologic exam. Current complaints: none.    Vaginal dryness, does try a lube that does help.   Denies abnormal vaginal bleeding, discharge, pelvic pain, problems with intercourse or other gynecologic concerns. Declines STI screen.  LEEP 2014 Pap 2021 normal, neg hrHPV Pap 03/2023 normal, neg hrHPV   Gynecologic History No LMP recorded. Patient is postmenopausal. Contraception: post menopausal status and tubal ligation Last Pap: 03/2023. Results: normal Last mammogram: 01/2024. Results: Birads 1 DEXA: has never had  Obstetric History OB History  Gravida Para Term Preterm AB Living  3     3  SAB IAB Ectopic Multiple Live Births          # Outcome Date GA Lbr Len/2nd Weight Sex Type Anes PTL Lv  3 Gravida           2 Gravida           1 Gravida             Past Medical History:  Diagnosis Date   Anxiety    Chronic headaches    Colon polyps    Depression    Fibroid    Fibromyalgia    Hyperlipidemia    IBS (irritable bowel syndrome)    Mitral valve prolapse    SVT (supraventricular tachycardia)    UTI (urinary tract infection)    Vaginal Pap smear, abnormal    Vitamin D deficiency     Past Surgical History:  Procedure Laterality Date   abdominal laproscopic surgery     BREAST BIOPSY Right    BREAST EXCISIONAL BIOPSY Right    x3   BREAST EXCISIONAL BIOPSY Left    CERVICAL BIOPSY  W/ LOOP ELECTRODE EXCISION     FOOT SURGERY     SVT ABLATION N/A 05/01/2020   Procedure: SVT ABLATION;  Surgeon: Kelsie Agent, MD;  Location: [facility];  Service: Cardiovascular;  Laterality: N/A;   thumb surgery     TUBAL LIGATION     WISDOM TOOTH EXTRACTION      Medications Ordered Prior to Encounter[1]  Allergies[2]  Social History   Socioeconomic History   Marital status: Significant Other    Spouse name: Not on file   Number  of children: 3   Years of education: Not on file   Highest education level: Not on file  Occupational History   Occupation: [employer]  Tobacco Use   Smoking status: Former    Current packs/day: 0.00    Types: Cigarettes    Quit date: 01/03/2007    Years since quitting: 17.3   Smokeless tobacco: Never  Vaping Use   Vaping status: Never Used  Substance and Sexual Activity   Alcohol use: Yes    Alcohol/week: 1.0 standard drink of alcohol    Types: 1 Standard drinks or equivalent per week    Comment: 1-3 per week   Drug use: Not Currently    Types: Marijuana   Sexual activity: Yes    Birth control/protection: Surgical  Other Topics Concern   Not on file  Social History Narrative   Lives home with Eva [place], Husband in [place].  LilaRose-daughter.  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  Children 3.  No caffeine since 51yrs old.   Social Drivers of Health   Tobacco Use: Medium Risk (04/21/2024)   Patient History  Smoking Tobacco Use: Former    Smokeless Tobacco Use: Never    Passive Exposure: Not on Actuary Strain: Not on file  Food Insecurity: Not on file  Transportation Needs: Not on file  Physical Activity: Not on file  Stress: Not on file  Social Connections: Not on file  Intimate Partner Violence: Not on file  Depression (PHQ2-9): Medium Risk (04/21/2024)   Depression (PHQ2-9)    PHQ-2 Score: 5  Alcohol Screen: Not on file  Housing: Not on file  Utilities: Not on file  Health Literacy: Not on file    Family History  Problem Relation Age of Onset   Fibromyalgia Mother    Depression Mother    Anxiety disorder Mother    Heart disease Mother    Alcohol abuse Maternal Uncle    Alcohol abuse Maternal Grandmother    Ovarian cancer Maternal Grandmother 37   Bladder Cancer Maternal Grandmother    Breast cancer Neg Hx    BRCA 1/2 Neg Hx      The following portions of the patient's history were reviewed and updated as appropriate:  allergies, current medications, past family history, past medical history, past social history, past surgical history and problem list.  Review of Systems Pertinent items are noted in HPI.   Objective:  BP 93/62   Pulse 71   Ht 5' 7 (1.702 m)   Wt 172 lb 12.8 oz (78.4 kg)   BMI 27.06 kg/m  CONSTITUTIONAL: Well-developed, well-nourished female in no acute distress.  HENT:  Normocephalic, atraumatic, External right and left ear normal. Oropharynx is clear and moist EYES: Conjunctivae and EOM are normal. Pupils are equal, round, and reactive to light. No scleral icterus.  NECK: Normal range of motion, supple, no masses.  Normal thyroid.  SKIN: Skin is warm and dry. No rash noted. Not diaphoretic. No erythema. No pallor. NEUROLOGIC: Alert and oriented to person, place, and time. Normal reflexes, muscle tone coordination. No cranial nerve deficit noted. PSYCHIATRIC: Normal mood and affect. Normal behavior. Normal judgment and thought content. CARDIOVASCULAR: Normal heart rate noted RESPIRATORY: Effort normal, no problems with respiration noted. BREASTS: Symmetric in size. No masses, skin changes, nipple drainage, or lymphadenopathy. ABDOMEN: Soft, no distention noted.  No tenderness, rebound or guarding.  PELVIC: pt deferred MUSCULOSKELETAL: Normal range of motion. No tenderness.  No cyanosis, clubbing, or edema.   Exam done with chaperone present.   Assessment and Plan:   1. Well woman exam (Primary) Pap UTD  2. Vaginal dryness Try vmagic Return if no improvement, may need estrogen cream   Encouraged improvement in diet and exercise.    Routine preventative health maintenance measures emphasized. Please refer to After Visit Summary for other counseling recommendations.    LOIS Yolanda Moats, MD, [facility] Attending Center for [facility])     [1]  Current Outpatient Medications on File Prior to Visit  Medication Sig Dispense Refill   Ascorbic Acid  (VITAMIN C) 500 MG CAPS      Aspirin  81 MG CAPS      busPIRone (BUSPAR) 10 MG tablet Take 10 mg by mouth 3 (three) times daily.     dexlansoprazole (DEXILANT) 60 MG capsule Take 60 mg by mouth daily.     docusate sodium (COLACE) 100 MG capsule      DULoxetine  (CYMBALTA ) 60 MG capsule Take 2 capsules (120 mg total) by mouth daily. 180 capsule 0   ergocalciferol (VITAMIN D2) 1.25 MG (50000 UT) capsule Take 50,000 Units  by mouth every Sunday.     fluticasone (FLONASE) 50 MCG/ACT nasal spray Place 1 spray into both nostrils daily.     gabapentin  (NEURONTIN ) 300 MG capsule Take 300-600 mg by mouth 2 (two) times daily. 300 mg in the morning, 300 mg at afternoon, and Take 600 mg by mouth at bedtime     levothyroxine  (SYNTHROID , LEVOTHROID) 50 MCG tablet Take 0.025 mcg by mouth daily before breakfast.     loratadine (CLARITIN) 10 MG tablet Take 10 mg by mouth daily.     mirabegron  ER (MYRBETRIQ ) 50 MG TB24 tablet Take 50 mg by mouth daily.     Multiple Vitamins-Minerals (MULTIVITAMIN WITH MINERALS) tablet Take 1 tablet by mouth daily.     NARCAN 4 MG/0.1ML LIQD nasal spray kit Place 1 spray into the nose once.      Semaglutide (OZEMPIC, 0.25 OR 0.5 MG/DOSE, Vian) Inject into the skin.     SUMAtriptan (IMITREX) 50 MG tablet Take 50 mg by mouth every 2 (two) hours as needed for headache or migraine.     tiZANidine (ZANAFLEX) 4 MG capsule Take 4 mg by mouth 3 (three) times daily.     topiramate (TOPAMAX) 100 MG tablet Take 100 mg by mouth at bedtime.     traMADol (ULTRAM) 50 MG tablet Take 50 mg by mouth 2 (two) times daily as needed.     cetirizine  (ZYRTEC  ALLERGY) 10 MG tablet Take 1 tablet (10 mg total) by mouth daily. 30 tablet 0   HYDROcodone -Acetaminophen  7.5-300 MG TABS Take by mouth.     ibuprofen  (ADVIL ) 600 MG tablet Take 1 tablet (600 mg total) by mouth every 6 (six) hours as needed. 30 tablet 0   LINZESS 72 MCG capsule Take 72-144 mcg by mouth daily.     pravastatin  (PRAVACHOL ) 40 MG tablet  SMARTSIG:1 Tablet(s) By Mouth Every Evening     promethazine -dextromethorphan (PROMETHAZINE -DM) 6.25-15 MG/5ML syrup Take 5 mLs by mouth 3 (three) times daily as needed for cough. 200 mL 0   No current facility-administered medications on file prior to visit.  [2] No Known Allergies  "
